# Patient Record
Sex: Female | Born: 1950 | Race: White | Hispanic: No | Smoking: Current every day smoker
Health system: Southern US, Community
[De-identification: ages and names within clinical notes are randomized; demographics above are authoritative.]

## PROBLEM LIST (undated history)

## (undated) DIAGNOSIS — M8580 Other specified disorders of bone density and structure, unspecified site: Secondary | ICD-10-CM

## (undated) DIAGNOSIS — J4540 Moderate persistent asthma, uncomplicated: Secondary | ICD-10-CM

## (undated) DIAGNOSIS — J302 Other seasonal allergic rhinitis: Secondary | ICD-10-CM

## (undated) DIAGNOSIS — H269 Unspecified cataract: Secondary | ICD-10-CM

## (undated) DIAGNOSIS — T7840XA Allergy, unspecified, initial encounter: Secondary | ICD-10-CM

## (undated) HISTORY — PX: LAPAROSCOPIC BILATERAL SALPINGO OOPHERECTOMY: SHX5890

## (undated) HISTORY — PX: ABDOMINAL HYSTERECTOMY: SHX81

## (undated) HISTORY — DX: Other specified disorders of bone density and structure, unspecified site: M85.80

## (undated) HISTORY — DX: Unspecified cataract: H26.9

## (undated) HISTORY — PX: ADENOIDECTOMY: SUR15

## (undated) HISTORY — DX: Allergy, unspecified, initial encounter: T78.40XA

## (undated) HISTORY — DX: Moderate persistent asthma, uncomplicated: J45.40

## (undated) HISTORY — PX: TONSILLECTOMY AND ADENOIDECTOMY: SHX28

## (undated) HISTORY — PX: TONSILLECTOMY: SUR1361

## (undated) HISTORY — DX: Other seasonal allergic rhinitis: J30.2

---

## 1999-11-21 ENCOUNTER — Encounter: Admission: RE | Admit: 1999-11-21 | Discharge: 1999-11-21 | Payer: Self-pay | Admitting: Family Medicine

## 1999-11-21 ENCOUNTER — Encounter: Payer: Self-pay | Admitting: Family Medicine

## 2002-02-01 ENCOUNTER — Ambulatory Visit (HOSPITAL_COMMUNITY): Admission: RE | Admit: 2002-02-01 | Discharge: 2002-02-01 | Payer: Self-pay | Admitting: Internal Medicine

## 2005-01-21 ENCOUNTER — Ambulatory Visit (HOSPITAL_COMMUNITY): Admission: RE | Admit: 2005-01-21 | Discharge: 2005-01-21 | Payer: Self-pay | Admitting: Family Medicine

## 2005-10-03 ENCOUNTER — Emergency Department (HOSPITAL_COMMUNITY): Admission: EM | Admit: 2005-10-03 | Discharge: 2005-10-03 | Payer: Self-pay | Admitting: Emergency Medicine

## 2006-03-02 ENCOUNTER — Ambulatory Visit (HOSPITAL_COMMUNITY): Admission: RE | Admit: 2006-03-02 | Discharge: 2006-03-02 | Payer: Self-pay | Admitting: Family Medicine

## 2008-03-31 ENCOUNTER — Emergency Department (HOSPITAL_COMMUNITY): Admission: EM | Admit: 2008-03-31 | Discharge: 2008-03-31 | Payer: Self-pay | Admitting: Emergency Medicine

## 2008-10-05 DIAGNOSIS — M8580 Other specified disorders of bone density and structure, unspecified site: Secondary | ICD-10-CM

## 2008-10-05 HISTORY — DX: Other specified disorders of bone density and structure, unspecified site: M85.80

## 2009-02-04 ENCOUNTER — Ambulatory Visit (HOSPITAL_COMMUNITY): Admission: RE | Admit: 2009-02-04 | Discharge: 2009-02-04 | Payer: Self-pay | Admitting: Family Medicine

## 2009-02-19 ENCOUNTER — Ambulatory Visit (HOSPITAL_COMMUNITY): Admission: RE | Admit: 2009-02-19 | Discharge: 2009-02-19 | Payer: Self-pay | Admitting: Family Medicine

## 2009-03-02 ENCOUNTER — Emergency Department (HOSPITAL_COMMUNITY): Admission: EM | Admit: 2009-03-02 | Discharge: 2009-03-02 | Payer: Self-pay | Admitting: Emergency Medicine

## 2010-02-18 ENCOUNTER — Ambulatory Visit (HOSPITAL_COMMUNITY): Admission: RE | Admit: 2010-02-18 | Discharge: 2010-02-18 | Payer: Self-pay | Admitting: Family Medicine

## 2010-05-23 ENCOUNTER — Emergency Department (HOSPITAL_COMMUNITY): Admission: EM | Admit: 2010-05-23 | Discharge: 2010-05-23 | Payer: Self-pay | Admitting: Emergency Medicine

## 2010-08-13 ENCOUNTER — Emergency Department (HOSPITAL_COMMUNITY): Admission: EM | Admit: 2010-08-13 | Discharge: 2010-08-13 | Payer: Self-pay | Admitting: Emergency Medicine

## 2010-10-09 ENCOUNTER — Ambulatory Visit (HOSPITAL_COMMUNITY): Admission: RE | Admit: 2010-10-09 | Payer: Self-pay | Source: Home / Self Care | Admitting: Family Medicine

## 2010-11-05 ENCOUNTER — Encounter: Payer: Self-pay | Admitting: Family Medicine

## 2010-12-16 LAB — URINALYSIS, ROUTINE W REFLEX MICROSCOPIC: Ketones, ur: 40 mg/dL — AB

## 2010-12-16 LAB — URINE MICROSCOPIC-ADD ON

## 2010-12-16 LAB — URINE CULTURE: Colony Count: NO GROWTH

## 2011-12-21 ENCOUNTER — Other Ambulatory Visit: Payer: Self-pay | Admitting: Family Medicine

## 2011-12-21 DIAGNOSIS — Z139 Encounter for screening, unspecified: Secondary | ICD-10-CM

## 2011-12-24 ENCOUNTER — Ambulatory Visit (HOSPITAL_COMMUNITY)
Admission: RE | Admit: 2011-12-24 | Discharge: 2011-12-24 | Disposition: A | Discharge status: Home / Self Care | Payer: BC Managed Care – PPO | Source: Ambulatory Visit | Attending: Family Medicine | Admitting: Family Medicine

## 2011-12-24 DIAGNOSIS — Z1231 Encounter for screening mammogram for malignant neoplasm of breast: Secondary | ICD-10-CM | POA: Insufficient documentation

## 2011-12-24 DIAGNOSIS — Z139 Encounter for screening, unspecified: Secondary | ICD-10-CM

## 2012-03-01 ENCOUNTER — Other Ambulatory Visit: Payer: Self-pay

## 2012-03-01 ENCOUNTER — Telehealth: Payer: Self-pay

## 2012-03-01 DIAGNOSIS — Z139 Encounter for screening, unspecified: Secondary | ICD-10-CM

## 2012-03-01 NOTE — Telephone Encounter (Signed)
Gastroenterology Pre-Procedure Form    Request Date: 03/01/2012          Requesting Physician: Dr. Lilyan Punt      PATIENT INFORMATION:  Katrina Dean is a 61 y.o., female (DOB=[birth year]).  PROCEDURE: Procedure(s) requested: colonoscopy Procedure Reason: screening for colon cancer  PATIENT REVIEW QUESTIONS: The patient reports the following:   1. Diabetes Melitis: no 2. Joint replacements in the past 12 months: no 3. Major health problems in the past 3 months: no 4. Has an artificial valve or MVP:no 5. Has been advised in past to take antibiotics in advance of a procedure like teeth cleaning: no}    MEDICATIONS & ALLERGIES:    Patient reports the following regarding taking any blood thinners:   Plavix? no Aspirin?no Coumadin?  no  Patient confirms/reports the following medications:  Current Outpatient Prescriptions  Medication Sig Dispense Refill  . Multiple Vitamin (MULTIVITAMIN) tablet Take 1 tablet by mouth daily.      . NON FORMULARY Calcium 600 mg plus Vit D     One tablet daily      . NON FORMULARY Goody powders                      She takes one about twice weekly      . pantoprazole (PROTONIX) 40 MG tablet Take 40 mg by mouth daily.        Patient confirms/reports the following allergies:  Allergies  Allergen Reactions  . Erythromycin Itching and Rash  . Penicillins Itching and Rash    Patient is appropriate to schedule for requested procedure(s): yes  AUTHORIZATION INFORMATION Primary Insurance:   ID #:  Group #:  Pre-Cert / Auth required: Pre-Cert / Auth #:   Secondary Insurance:   ID #:   Group #:  Pre-Cert / Auth required: Pre-Cert / Auth #:   No orders of the defined types were placed in this encounter.    SCHEDULE INFORMATION: Procedure has been scheduled as follows:  Date: 03/17/2012    Time: 12:45 PM  Location: [facility] Short Stay  This Gastroenterology Pre-Precedure Form is being routed to the following provider(s) for  review: R. Roetta Sessions, MD

## 2012-03-03 NOTE — Telephone Encounter (Signed)
OK to proceed with colonoscopy.

## 2012-03-03 NOTE — Telephone Encounter (Signed)
Rx and instructions mailed.  

## 2012-03-11 ENCOUNTER — Encounter (HOSPITAL_COMMUNITY): Payer: Self-pay | Admitting: Pharmacy Technician

## 2012-03-15 ENCOUNTER — Telehealth: Payer: Self-pay

## 2012-03-15 NOTE — Telephone Encounter (Signed)
Pt called to cancel her appt on 03/17/2012 with [facility]MR for colonoscopy. Her manager is going to be out of town for medical reasons and she cannot leave work. 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 Cancelled appt and informed Kim.

## 2012-03-17 ENCOUNTER — Encounter (HOSPITAL_COMMUNITY): Admission: RE | Payer: Self-pay | Source: Ambulatory Visit

## 2012-03-17 ENCOUNTER — Ambulatory Visit (HOSPITAL_COMMUNITY)
Admission: RE | Admit: 2012-03-17 | Payer: BC Managed Care – PPO | Source: Ambulatory Visit | Admitting: Internal Medicine

## 2012-03-17 SURGERY — COLONOSCOPY
Anesthesia: Moderate Sedation

## 2012-06-01 ENCOUNTER — Telehealth: Payer: Self-pay

## 2012-06-01 NOTE — Telephone Encounter (Signed)
Called pt to see if she is ready to reschedule the colonoscopy that she had to cancel in June with RMR. She said she is still doing some state testing and she will call when she is ready. She said it will probably be at least another month.

## 2013-02-06 ENCOUNTER — Telehealth: Payer: Self-pay | Admitting: Nurse Practitioner

## 2013-02-06 DIAGNOSIS — E785 Hyperlipidemia, unspecified: Secondary | ICD-10-CM

## 2013-02-06 DIAGNOSIS — Z0000 Encounter for general adult medical examination without abnormal findings: Secondary | ICD-10-CM

## 2013-02-06 DIAGNOSIS — D649 Anemia, unspecified: Secondary | ICD-10-CM

## 2013-02-06 DIAGNOSIS — E559 Vitamin D deficiency, unspecified: Secondary | ICD-10-CM

## 2013-02-06 NOTE — Telephone Encounter (Signed)
bw papers ready for pick up. Pt notified.

## 2013-02-06 NOTE — Telephone Encounter (Signed)
BW papers for PE on 5/15 please add any test for anemia & Lukemia (due to family History)

## 2013-02-06 NOTE — Telephone Encounter (Signed)
Typically a CBC will indicate if there is any problems with the bone marrow. So therefore do a CBC. Also patient needs vitamin D level, glucose, lipid profile as part of her screenings. She does have a history of hyperlipidemia and vitamin D deficiency.

## 2013-02-09 LAB — CBC WITH DIFFERENTIAL/PLATELET
Basophils Absolute: 0 10*3/uL (ref 0.0–0.1)
Basophils Relative: 1 % (ref 0–1)
Eosinophils Absolute: 0.1 10*3/uL (ref 0.0–0.7)
HCT: 40.7 % (ref 36.0–46.0)
Hemoglobin: 13.8 g/dL (ref 12.0–15.0)
Lymphocytes Relative: 39 % (ref 12–46)
Monocytes Absolute: 0.4 10*3/uL (ref 0.1–1.0)
RDW: 13.2 % (ref 11.5–15.5)
WBC: 6.1 10*3/uL (ref 4.0–10.5)

## 2013-02-09 LAB — LIPID PANEL
Cholesterol: 235 mg/dL — ABNORMAL HIGH (ref 0–200)
HDL: 49 mg/dL (ref >39)
Triglycerides: 100 mg/dL (ref <150)

## 2013-02-10 LAB — VITAMIN D 25 HYDROXY (VIT D DEFICIENCY, FRACTURES): Vit D, 25-Hydroxy: 23 ng/mL — ABNORMAL LOW (ref 30–89)

## 2013-02-13 ENCOUNTER — Encounter: Payer: Self-pay | Admitting: Family Medicine

## 2013-02-14 ENCOUNTER — Encounter: Payer: Self-pay | Admitting: *Deleted

## 2013-02-16 ENCOUNTER — Encounter: Payer: Self-pay | Admitting: Nurse Practitioner

## 2013-02-16 ENCOUNTER — Encounter: Payer: Self-pay | Admitting: Family Medicine

## 2013-02-16 ENCOUNTER — Ambulatory Visit (INDEPENDENT_AMBULATORY_CARE_PROVIDER_SITE_OTHER): Payer: 59 | Admitting: Nurse Practitioner

## 2013-02-16 VITALS — BP 134/90 | HR 70 | Ht 63.5 in | Wt 157.4 lb

## 2013-02-16 DIAGNOSIS — Z0000 Encounter for general adult medical examination without abnormal findings: Secondary | ICD-10-CM

## 2013-02-16 DIAGNOSIS — M949 Disorder of cartilage, unspecified: Secondary | ICD-10-CM

## 2013-02-16 DIAGNOSIS — Z79899 Other long term (current) drug therapy: Secondary | ICD-10-CM

## 2013-02-16 DIAGNOSIS — Z01419 Encounter for gynecological examination (general) (routine) without abnormal findings: Secondary | ICD-10-CM

## 2013-02-16 DIAGNOSIS — M899 Disorder of bone, unspecified: Secondary | ICD-10-CM

## 2013-02-16 DIAGNOSIS — M8580 Other specified disorders of bone density and structure, unspecified site: Secondary | ICD-10-CM

## 2013-02-16 DIAGNOSIS — E785 Hyperlipidemia, unspecified: Secondary | ICD-10-CM

## 2013-02-16 MED ORDER — PRAVASTATIN SODIUM 20 MG PO TABS: 20.0000 mg | ORAL_TABLET | Freq: Every day | ORAL | Status: DC

## 2013-02-16 NOTE — Progress Notes (Signed)
Screening mammogram scheduled [facility] 02-21-13 at 12:30. Bone Density Test at [facility] 02-21-2013 2:15. Pt notified. Hemoccult cards given and explained to patient. Colonoscopy info sheet given to patient. Patient can make her own appointment for colonoscopy.

## 2013-02-16 NOTE — Patient Instructions (Signed)
[facility] Dept; [email or web address]

## 2013-02-20 ENCOUNTER — Encounter: Payer: Self-pay | Admitting: Nurse Practitioner

## 2013-02-20 DIAGNOSIS — E785 Hyperlipidemia, unspecified: Secondary | ICD-10-CM | POA: Insufficient documentation

## 2013-02-20 DIAGNOSIS — M8580 Other specified disorders of bone density and structure, unspecified site: Secondary | ICD-10-CM | POA: Insufficient documentation

## 2013-02-20 NOTE — Assessment & Plan Note (Signed)
Start pravastatin 20 mg daily. Repeat lipid and liver profiles in 8-10 weeks.

## 2013-02-20 NOTE — Assessment & Plan Note (Signed)
Repeat bone density ordered. Continue calcium and vitamin D supplement.

## 2013-02-20 NOTE — Progress Notes (Signed)
  Subjective:    Patient ID: Katrina Dean, female    DOB: 12/26/1950, 62 y.o.   MRN: [id number]  HPI presents for her wellness checkup. Gets regular eye exams. Has not had dental exam in a while, recommend that she get this before the end of the year. Takes regular calcium and vitamin D. Is due for her mammogram. Married, same sexual partner. No vaginal discharge. No pelvic pain. Has not had her colonoscopy.    Review of Systems  Constitutional: Positive for fatigue. Negative for activity change and appetite change.  HENT: Negative for dental problem.   Eyes: Negative for visual disturbance.  Respiratory: Negative for chest tightness, shortness of breath and wheezing.   Cardiovascular: Negative for chest pain.  Gastrointestinal: Negative for vomiting, diarrhea, constipation, blood in stool and abdominal distention.  Genitourinary: Negative for dysuria, vaginal discharge, enuresis, difficulty urinating and pelvic pain.   no unusual cough. No hemoptysis. No edema.     Objective:   Physical Exam  Constitutional: She is oriented to person, place, and time. She appears well-developed. No distress.  HENT:  Right Ear: External ear normal.  Left Ear: External ear normal.  Mouth/Throat: Oropharynx is clear and moist.  Neck: Normal range of motion. Neck supple. No tracheal deviation present. No thyromegaly present.  Cardiovascular: Normal rate, regular rhythm and normal heart sounds.  Exam reveals no gallop.   No murmur heard. Pulmonary/Chest: Effort normal and breath sounds normal.  Abdominal: Soft. She exhibits no distension and no mass. There is no tenderness.  Genitourinary: Vagina normal. No vaginal discharge found.  Musculoskeletal: She exhibits no edema.  Lymphadenopathy:    She has no cervical adenopathy.  Neurological: She is alert and oriented to person, place, and time.  Skin: Skin is warm and dry. No rash noted.  Psychiatric: She has a normal mood and affect. Her behavior is  normal.   breast exam: No masses noted. Axilla no adenopathy. Rectal exam normal with, no stool for Hemoccult. EGBUS normal limit. Vagina pale and moist.       Assessment & Plan:  Well woman exam - Plan: MM Digital Screening, POC Hemoccult Bld/Stl (3-Cd Home Screen), MM Digital Screening, POC Hemoccult Bld/Stl (3-Cd Home Screen)  Hyperlipemia - Plan: Lipid panel, Lipid panel  High risk medication use - Plan: Hepatic function panel, Hepatic function panel  Osteopenia - Plan: DG Bone Density, DG Bone Density  Routine general medical examination at a health care facility - Plan: MM Digital Screening, POC Hemoccult Bld/Stl (3-Cd Home Screen), MM Digital Screening, POC Hemoccult Bld/Stl (3-Cd Home Screen)  Plan: Reviewed her most recent lipid profile. Discussed risk associated with elevated LDL particularly with smoking. Meds ordered this encounter  Medications  . pravastatin (PRAVACHOL) 20 MG tablet    Sig: Take 1 tablet (20 mg total) by mouth daily. For cholesterol    Dispense:  30 tablet    Refill:  2    Order Specific Question:  Supervising Provider    Answer:  Merlyn Albert [[id number]]   repeat lipid and liver profile in 8-10 weeks, call back sooner if any problems with medication. Recommend regular exercise. Healthy diet. Dental exam performed into the year. Given information on colonoscopy. Next physical in one year.

## 2013-02-21 ENCOUNTER — Ambulatory Visit (HOSPITAL_COMMUNITY)
Admission: RE | Admit: 2013-02-21 | Discharge: 2013-02-21 | Disposition: A | Discharge status: Home / Self Care | Payer: 59 | Source: Ambulatory Visit | Attending: Nurse Practitioner | Admitting: Nurse Practitioner

## 2013-02-21 DIAGNOSIS — Z1231 Encounter for screening mammogram for malignant neoplasm of breast: Secondary | ICD-10-CM | POA: Insufficient documentation

## 2013-02-21 DIAGNOSIS — M949 Disorder of cartilage, unspecified: Secondary | ICD-10-CM | POA: Insufficient documentation

## 2013-02-21 DIAGNOSIS — M899 Disorder of bone, unspecified: Secondary | ICD-10-CM | POA: Insufficient documentation

## 2013-02-23 ENCOUNTER — Other Ambulatory Visit: Payer: Self-pay | Admitting: Nurse Practitioner

## 2013-02-24 ENCOUNTER — Other Ambulatory Visit: Payer: Self-pay | Admitting: Nurse Practitioner

## 2013-02-24 ENCOUNTER — Telehealth: Payer: Self-pay | Admitting: Family Medicine

## 2013-02-24 DIAGNOSIS — R928 Other abnormal and inconclusive findings on diagnostic imaging of breast: Secondary | ICD-10-CM

## 2013-02-24 NOTE — Telephone Encounter (Signed)
Patient wanted to get mammogram for May 28. I called [facility] and they were competely booked for that day. Pt notified she is going to keep her appointment for june

## 2013-02-24 NOTE — Telephone Encounter (Signed)
Patient is calling because she has a question about her mammogram

## 2013-03-01 LAB — POC HEMOCCULT BLD/STL (HOME/3-CARD/SCREEN): Card #3 Fecal Occult Blood, POC: NEGATIVE

## 2013-03-08 ENCOUNTER — Ambulatory Visit (HOSPITAL_COMMUNITY)
Admission: RE | Admit: 2013-03-08 | Discharge: 2013-03-08 | Disposition: A | Discharge status: Home / Self Care | Payer: 59 | Source: Ambulatory Visit | Attending: Nurse Practitioner | Admitting: Nurse Practitioner

## 2013-03-08 DIAGNOSIS — R928 Other abnormal and inconclusive findings on diagnostic imaging of breast: Secondary | ICD-10-CM

## 2013-03-08 DIAGNOSIS — N6002 Solitary cyst of left breast: Secondary | ICD-10-CM | POA: Insufficient documentation

## 2013-03-15 ENCOUNTER — Telehealth: Payer: Self-pay | Admitting: Nurse Practitioner

## 2013-03-15 NOTE — Telephone Encounter (Signed)
Pt advised card negative x 3.

## 2013-03-15 NOTE — Telephone Encounter (Signed)
Would like to know if results from Hemocult test are back?  If so, Patient would like to know the results.  Thanks

## 2013-03-27 ENCOUNTER — Ambulatory Visit (INDEPENDENT_AMBULATORY_CARE_PROVIDER_SITE_OTHER): Payer: 59 | Admitting: Family Medicine

## 2013-03-27 ENCOUNTER — Encounter: Payer: Self-pay | Admitting: Family Medicine

## 2013-03-27 VITALS — BP 120/86 | Temp 98.8°F | Ht 63.75 in | Wt 156.2 lb

## 2013-03-27 DIAGNOSIS — J019 Acute sinusitis, unspecified: Secondary | ICD-10-CM

## 2013-03-27 MED ORDER — DOXYCYCLINE HYCLATE 100 MG PO CAPS: 100.0000 mg | ORAL_CAPSULE | Freq: Two times a day (BID) | ORAL | Status: DC

## 2013-03-27 NOTE — Progress Notes (Signed)
  Subjective:    Patient ID: Katrina Dean, female    DOB: Jun 16, 1951, 62 y.o.   MRN: [id number]  Sinusitis This is a new problem. The current episode started in the past 7 days. The problem is unchanged. There has been no fever. Her pain is at a severity of 3/10. The pain is moderate. Associated symptoms include congestion and coughing. Pertinent negatives include no ear pain or shortness of breath. (Drainage) Past treatments include nothing. The treatment provided no relief.  Patient also has a stye on left eye since accidentally getting some shampoo in it over the weekend.    Review of Systems  Constitutional: Negative for fever and activity change.  HENT: Positive for congestion and rhinorrhea. Negative for ear pain.   Eyes: Negative for discharge.  Respiratory: Positive for cough. Negative for shortness of breath and wheezing.   Cardiovascular: Negative for chest pain.       Objective:   Physical Exam  Nursing note and vitals reviewed. Constitutional: She appears well-developed.  HENT:  Head: Normocephalic.  Right Ear: External ear normal.  Left Ear: External ear normal.  Nose: Nose normal.  Mouth/Throat: Oropharynx is clear and moist. No oropharyngeal exudate.  Left eye stye  Neck: Neck supple.  Cardiovascular: Normal rate and normal heart sounds.   No murmur heard. Pulmonary/Chest: Effort normal and breath sounds normal. She has no wheezes.  Lymphadenopathy:    She has no cervical adenopathy.  Skin: Skin is warm and dry.   Left eye has a ear edematous area along the base of the eye lid consistent with a stye       Assessment & Plan:  Stye-doxycycline twice a day for the next 10 days followup if problems Sinusitis-doxycycline twice a day 10 days Patient now smoking a couple cigarettes a day I told her it be best to totally quit

## 2013-09-22 ENCOUNTER — Other Ambulatory Visit: Payer: Self-pay | Admitting: Family Medicine

## 2013-09-22 DIAGNOSIS — N649 Disorder of breast, unspecified: Secondary | ICD-10-CM

## 2013-09-29 ENCOUNTER — Ambulatory Visit
Admission: RE | Admit: 2013-09-29 | Discharge: 2013-09-29 | Disposition: A | Discharge status: Home / Self Care | Payer: 59 | Source: Ambulatory Visit | Attending: Family Medicine | Admitting: Family Medicine

## 2013-09-29 DIAGNOSIS — N649 Disorder of breast, unspecified: Secondary | ICD-10-CM

## 2014-03-12 ENCOUNTER — Encounter: Payer: Self-pay | Admitting: Family Medicine

## 2014-03-12 ENCOUNTER — Ambulatory Visit (INDEPENDENT_AMBULATORY_CARE_PROVIDER_SITE_OTHER): Payer: 59 | Admitting: Family Medicine

## 2014-03-12 VITALS — BP 128/80 | Temp 98.3°F | Ht 64.0 in | Wt 159.1 lb

## 2014-03-12 DIAGNOSIS — R197 Diarrhea, unspecified: Secondary | ICD-10-CM

## 2014-03-12 DIAGNOSIS — Z79899 Other long term (current) drug therapy: Secondary | ICD-10-CM

## 2014-03-12 DIAGNOSIS — M949 Disorder of cartilage, unspecified: Secondary | ICD-10-CM

## 2014-03-12 DIAGNOSIS — M899 Disorder of bone, unspecified: Secondary | ICD-10-CM

## 2014-03-12 DIAGNOSIS — M8580 Other specified disorders of bone density and structure, unspecified site: Secondary | ICD-10-CM

## 2014-03-12 DIAGNOSIS — R109 Unspecified abdominal pain: Secondary | ICD-10-CM

## 2014-03-12 DIAGNOSIS — E785 Hyperlipidemia, unspecified: Secondary | ICD-10-CM

## 2014-03-12 MED ORDER — BUPROPION HCL ER (SR) 150 MG PO TB12: 150.0000 mg | ORAL_TABLET | Freq: Two times a day (BID) | ORAL | Status: DC

## 2014-03-12 MED ORDER — DIPHENOXYLATE-ATROPINE 2.5-0.025 MG PO TABS: 1.0000 | ORAL_TABLET | Freq: Two times a day (BID) | ORAL | Status: DC | PRN

## 2014-03-12 NOTE — Progress Notes (Signed)
   Subjective:    Patient ID: Katrina Dean, female    DOB: 03-20-1951, 63 y.o.   MRN: [id number]  Emesis  This is a new problem. The current episode started in the past 7 days. The problem occurs intermittently. The problem has been gradually worsening. The emesis has an appearance of stomach contents. Maximum temperature: 99.2. Associated symptoms include abdominal pain, diarrhea and dizziness. Pertinent negatives include no coughing. Associated symptoms comments: nausea. Treatments tried: pepto bismol, pepcid, tums. The treatment provided no relief.   she does have an issue of lactose intolerance. Patient states she has a tick bite on her chest she would like the doctor to look at.   Patient also has a history of hyperlipidemia he she is not had any testing recently to look at this.  Review of Systems  Respiratory: Negative for cough.   Gastrointestinal: Positive for vomiting, abdominal pain and diarrhea.  Neurological: Positive for dizziness.       Objective:   Physical Exam  Vitals reviewed. Constitutional: She appears well-nourished. No distress.  Cardiovascular: Normal rate, regular rhythm and normal heart sounds.   No murmur heard. Pulmonary/Chest: Effort normal and breath sounds normal. No respiratory distress.  Abdominal: Soft. She exhibits no distension. There is tenderness. There is no rebound and no guarding.  Musculoskeletal: She exhibits no edema.  Lymphadenopathy:    She has no cervical adenopathy.  Neurological: She is alert. She exhibits normal muscle tone.  Psychiatric: Her behavior is normal.          Assessment & Plan:  #1 diarrhea-this been going on for several weeks. I really doubt that it's do to routine infection. We will be doing some tests to rule out other possibilities. Warning signs were discussed. If bloody stools high fever or worse call back Lomotil up to twice daily as needed await the results of the other tests.  #2 patient had a tick bite  I find no evidence of infection going on there if high fevers muscle aches worse call back  #3 smoking abuse patient going back to smoking she's been counseled that she really needs to quit

## 2014-03-13 ENCOUNTER — Other Ambulatory Visit: Payer: Self-pay | Admitting: Family Medicine

## 2014-03-13 DIAGNOSIS — N6489 Other specified disorders of breast: Secondary | ICD-10-CM

## 2014-03-13 LAB — CBC WITH DIFFERENTIAL/PLATELET
Basophils Absolute: 0.1 10*3/uL (ref 0.0–0.1)
Basophils Relative: 1 % (ref 0–1)
Eosinophils Absolute: 0.1 10*3/uL (ref 0.0–0.7)
Eosinophils Relative: 1 % (ref 0–5)
HCT: 44.1 % (ref 36.0–46.0)
Hemoglobin: 15.2 g/dL — ABNORMAL HIGH (ref 12.0–15.0)
Lymphocytes Relative: 41 % (ref 12–46)
Lymphs Abs: 2.3 10*3/uL (ref 0.7–4.0)
MCH: 30.8 pg (ref 26.0–34.0)
MCHC: 34.5 g/dL (ref 30.0–36.0)
MCV: 89.3 fL (ref 78.0–100.0)
Monocytes Absolute: 0.3 10*3/uL (ref 0.1–1.0)
Monocytes Relative: 6 % (ref 3–12)
Neutro Abs: 2.9 10*3/uL (ref 1.7–7.7)
Neutrophils Relative %: 51 % (ref 43–77)
Platelets: 257 10*3/uL (ref 150–400)
RBC: 4.94 MIL/uL (ref 3.87–5.11)
RDW: 13 % (ref 11.5–15.5)
WBC: 5.6 10*3/uL (ref 4.0–10.5)

## 2014-03-13 LAB — LIPID PANEL
Cholesterol: 244 mg/dL — ABNORMAL HIGH (ref 0–200)
HDL: 45 mg/dL
LDL Cholesterol: 176 mg/dL — ABNORMAL HIGH (ref 0–99)
Total CHOL/HDL Ratio: 5.4 ratio
Triglycerides: 113 mg/dL
VLDL: 23 mg/dL (ref 0–40)

## 2014-03-13 LAB — HEPATIC FUNCTION PANEL
ALT: 8 U/L (ref 0–35)
AST: 13 U/L (ref 0–37)
Albumin: 4.5 g/dL (ref 3.5–5.2)
Alkaline Phosphatase: 52 U/L (ref 39–117)
Bilirubin, Direct: 0.1 mg/dL (ref 0.0–0.3)
Indirect Bilirubin: 0.6 mg/dL (ref 0.2–1.2)
Total Bilirubin: 0.7 mg/dL (ref 0.2–1.2)
Total Protein: 7.6 g/dL (ref 6.0–8.3)

## 2014-03-13 LAB — BASIC METABOLIC PANEL
BUN: 9 mg/dL (ref 6–23)
CHLORIDE: 104 meq/L (ref 96–112)
CO2: 23 meq/L (ref 19–32)
CREATININE: 0.85 mg/dL (ref 0.50–1.10)
Calcium: 9.8 mg/dL (ref 8.4–10.5)
Glucose, Bld: 93 mg/dL (ref 70–99)
POTASSIUM: 4.7 meq/L (ref 3.5–5.3)
SODIUM: 139 meq/L (ref 135–145)

## 2014-03-14 LAB — TISSUE TRANSGLUTAMINASE, IGA: Tissue Transglutaminase Ab, IgA: 3.7 U/mL (ref <20)

## 2014-03-14 LAB — FECAL LACTOFERRIN, QUANT: LACTOFERRIN: NEGATIVE

## 2014-03-14 LAB — OVA AND PARASITE EXAMINATION: OP: NONE SEEN

## 2014-03-14 LAB — C. DIFFICILE GDH AND TOXIN A/B
C. DIFF TOXIN A/B: NOT DETECTED
C. difficile GDH: NOT DETECTED

## 2014-03-14 LAB — VITAMIN D 25 HYDROXY (VIT D DEFICIENCY, FRACTURES): Vit D, 25-Hydroxy: 24 ng/mL — ABNORMAL LOW (ref 30–89)

## 2014-03-17 LAB — STOOL CULTURE

## 2014-03-23 ENCOUNTER — Encounter: Payer: Self-pay | Admitting: Family Medicine

## 2014-03-23 ENCOUNTER — Ambulatory Visit (INDEPENDENT_AMBULATORY_CARE_PROVIDER_SITE_OTHER): Payer: 59 | Admitting: Family Medicine

## 2014-03-23 VITALS — BP 122/80 | Ht 64.0 in | Wt 161.1 lb

## 2014-03-23 DIAGNOSIS — D582 Other hemoglobinopathies: Secondary | ICD-10-CM

## 2014-03-23 DIAGNOSIS — E785 Hyperlipidemia, unspecified: Secondary | ICD-10-CM

## 2014-03-23 DIAGNOSIS — E559 Vitamin D deficiency, unspecified: Secondary | ICD-10-CM

## 2014-03-23 DIAGNOSIS — Z639 Problem related to primary support group, unspecified: Secondary | ICD-10-CM

## 2014-03-23 DIAGNOSIS — F439 Reaction to severe stress, unspecified: Secondary | ICD-10-CM

## 2014-03-23 MED ORDER — PRAVASTATIN SODIUM 20 MG PO TABS: 20.0000 mg | ORAL_TABLET | Freq: Every day | ORAL | Status: DC

## 2014-03-23 NOTE — Patient Instructions (Signed)
DASH Eating Plan  DASH stands for "Dietary Approaches to Stop Hypertension." The DASH eating plan is a healthy eating plan that has been shown to reduce high blood pressure (hypertension). Additional health benefits may include reducing the risk of type 2 diabetes mellitus, heart disease, and stroke. The DASH eating plan may also help with weight loss.  WHAT DO I NEED TO KNOW ABOUT THE DASH EATING PLAN?  For the DASH eating plan, you will follow these general guidelines:  · Choose foods with a percent daily value for sodium of less than 5% (as listed on the food label).  · Use salt-free seasonings or herbs instead of table salt or sea salt.  · Check with your health care Tova Vater or pharmacist before using salt substitutes.  · Eat lower-sodium products, often labeled as "lower sodium" or "no salt added."  · Eat fresh foods.  · Eat more vegetables, fruits, and low-fat dairy products.  · Choose whole grains. Look for the word "whole" as the first word in the ingredient list.  · Choose fish and skinless chicken or turkey more often than red meat. Limit fish, poultry, and meat to 6 oz (170 g) each day.  · Limit sweets, desserts, sugars, and sugary drinks.  · Choose heart-healthy fats.  · Limit cheese to 1 oz (28 g) per day.  · Eat more home-cooked food and less restaurant, buffet, and fast food.  · Limit fried foods.  · Cook foods using methods other than frying.  · Limit canned vegetables. If you do use them, rinse them well to decrease the sodium.  · When eating at a restaurant, ask that your food be prepared with less salt, or no salt if possible.  WHAT FOODS CAN I EAT?  Seek help from a dietitian for individual calorie needs.  Grains  Whole grain or whole wheat bread. Brown rice. Whole grain or whole wheat pasta. Quinoa, bulgur, and whole grain cereals. Low-sodium cereals. Corn or whole wheat flour tortillas. Whole grain cornbread. Whole grain crackers. Low-sodium crackers.  Vegetables  Fresh or frozen vegetables  (raw, steamed, roasted, or grilled). Low-sodium or reduced-sodium tomato and vegetable juices. Low-sodium or reduced-sodium tomato sauce and paste. Low-sodium or reduced-sodium canned vegetables.   Fruits  All fresh, canned (in natural juice), or frozen fruits.  Meat and Other Protein Products  Ground beef (85% or leaner), grass-fed beef, or beef trimmed of fat. Skinless chicken or turkey. Ground chicken or turkey. Pork trimmed of fat. All fish and seafood. Eggs. Dried beans, peas, or lentils. Unsalted nuts and seeds. Unsalted canned beans.  Dairy  Low-fat dairy products, such as skim or 1% milk, 2% or reduced-fat cheeses, low-fat ricotta or cottage cheese, or plain low-fat yogurt. Low-sodium or reduced-sodium cheeses.  Fats and Oils  Tub margarines without trans fats. Light or reduced-fat mayonnaise and salad dressings (reduced sodium). Avocado. Safflower, olive, or canola oils. Natural peanut or almond butter.  Other  Unsalted popcorn and pretzels.  The items listed above may not be a complete list of recommended foods or beverages. Contact your dietitian for more options.  WHAT FOODS ARE NOT RECOMMENDED?  Grains  White bread. White pasta. White rice. Refined cornbread. Bagels and croissants. Crackers that contain trans fat.  Vegetables  Creamed or fried vegetables. Vegetables in a cheese sauce. Regular canned vegetables. Regular canned tomato sauce and paste. Regular tomato and vegetable juices.  Fruits  Dried fruits. Canned fruit in light or heavy syrup. Fruit juice.  Meat and Other Protein   Products  Fatty cuts of meat. Ribs, chicken wings, bacon, sausage, bologna, salami, chitterlings, fatback, hot dogs, bratwurst, and packaged luncheon meats. Salted nuts and seeds. Canned beans with salt.  Dairy  Whole or 2% milk, cream, half-and-half, and cream cheese. Whole-fat or sweetened yogurt. Full-fat cheeses or blue cheese. Nondairy creamers and whipped toppings. Processed cheese, cheese spreads, or cheese  curds.  Condiments  Onion and garlic salt, seasoned salt, table salt, and sea salt. Canned and packaged gravies. Worcestershire sauce. Tartar sauce. Barbecue sauce. Teriyaki sauce. Soy sauce, including reduced sodium. Steak sauce. Fish sauce. Oyster sauce. Cocktail sauce. Horseradish. Ketchup and mustard. Meat flavorings and tenderizers. Bouillon cubes. Hot sauce. Tabasco sauce. Marinades. Taco seasonings. Relishes.  Fats and Oils  Butter, stick margarine, lard, shortening, ghee, and bacon fat. Coconut, palm kernel, or palm oils. Regular salad dressings.  Other  Pickles and olives. Salted popcorn and pretzels.  The items listed above may not be a complete list of foods and beverages to avoid. Contact your dietitian for more information.  WHERE CAN I FIND MORE INFORMATION?  National Heart, Lung, and Blood Institute: www.nhlbi.nih.gov/health/health-topics/topics/dash/  Document Released: 09/10/2011 Document Revised: 09/26/2013 Document Reviewed: 07/26/2013  ExitCare® Patient Information ©2015 [employer]. This information is not intended to replace advice given to you by your health care Katrina Dean. Make sure you discuss any questions you have with your health care Guadalupe Kerekes.

## 2014-03-23 NOTE — Progress Notes (Signed)
   Subjective:    Patient ID: Katrina Dean, female    DOB: 01/08/1951, 63 y.o.   MRN: [id number]  HPI Patient is here today to discuss her recent lab work results and possible treatment for abnormal values. Patient states that her hemoglobin is 15.1 and she is concerned about this. She does admit to smoking occasionally. He states he had many good until just recently. There is a family history of cardiovascular disease and cholesterol. She is under a lot of stress at home as well.     Review of Systems She denies chest pain shortness of breath she denies nausea vomiting abdominal pain she states the diarrhea has improved. She denies any rectal bleeding. She denies sweats chills muscle joint pains.    Objective:   Physical Exam  Lungs clear hearts regular pulse normal blood pressure good extremities no edema neck no masses  I went over all of her lab work including elevated hemoglobin liver enzymes cholesterol profile and kidney functions.      Assessment & Plan:  #1 hyperlipidemia-severe puts her at significant risk of heart disease I encourage her to do 81 mg aspirin also encourage her to start cholesterol medicine she would like to try pravastatin 20 mg initially she will take it 3 days per week we will check a lipid liver profile in approximately 8-12 weeks with followup office visit if it causes muscle pains discomfort or other side effects he is to stop it and let us know  #2 slight elevation in hemoglobin this is related to occasional use of smoking. I told her she needs to stop smoking. This should go back to being normal. Can recheck this in 6-12 months.  #3 patient is trying to eat healthy exercise I encouraged her that she's under a lot of stress with her relationship at home  #4 vitamin D level is low she was encouraged to take at least 2000 IUs vitamin D daily. She will do one calcium a day.

## 2014-03-26 ENCOUNTER — Other Ambulatory Visit: Payer: Self-pay | Admitting: Family Medicine

## 2014-03-26 ENCOUNTER — Other Ambulatory Visit: Payer: Self-pay

## 2014-03-26 DIAGNOSIS — N6489 Other specified disorders of breast: Secondary | ICD-10-CM

## 2014-03-30 ENCOUNTER — Ambulatory Visit
Admission: RE | Admit: 2014-03-30 | Discharge: 2014-03-30 | Disposition: A | Discharge status: Home / Self Care | Payer: 59 | Source: Ambulatory Visit | Attending: Family Medicine | Admitting: Family Medicine

## 2014-03-30 DIAGNOSIS — N6489 Other specified disorders of breast: Secondary | ICD-10-CM

## 2014-10-12 ENCOUNTER — Encounter: Payer: Self-pay | Admitting: Family Medicine

## 2014-10-12 ENCOUNTER — Other Ambulatory Visit: Payer: Self-pay | Admitting: *Deleted

## 2014-10-12 ENCOUNTER — Ambulatory Visit (INDEPENDENT_AMBULATORY_CARE_PROVIDER_SITE_OTHER): Payer: 59 | Admitting: Family Medicine

## 2014-10-12 VITALS — BP 124/80 | Temp 98.6°F | Ht 64.0 in | Wt 158.2 lb

## 2014-10-12 DIAGNOSIS — J0100 Acute maxillary sinusitis, unspecified: Secondary | ICD-10-CM

## 2014-10-12 MED ORDER — ALBUTEROL SULFATE HFA 108 (90 BASE) MCG/ACT IN AERS: INHALATION_SPRAY | RESPIRATORY_TRACT | Status: DC

## 2014-10-12 MED ORDER — LEVOFLOXACIN 500 MG PO TABS: 500.0000 mg | ORAL_TABLET | Freq: Every day | ORAL | Status: DC

## 2014-10-12 MED ORDER — CLONAZEPAM 0.5 MG PO TABS: 0.5000 mg | ORAL_TABLET | Freq: Two times a day (BID) | ORAL | Status: DC | PRN

## 2014-10-12 MED ORDER — AZITHROMYCIN 250 MG PO TABS: ORAL_TABLET | ORAL | Status: DC

## 2014-10-12 MED ORDER — ALBUTEROL SULFATE HFA 108 (90 BASE) MCG/ACT IN AERS: 2.0000 | INHALATION_SPRAY | Freq: Four times a day (QID) | RESPIRATORY_TRACT | Status: DC | PRN

## 2014-10-12 NOTE — Progress Notes (Signed)
   Subjective:    Patient ID: Katrina Dean, female    DOB: Aug 19, 1951, 64 y.o.   MRN: [id number]  Cough This is a new problem. The current episode started in the past 7 days. The problem has been unchanged. The cough is non-productive. Associated symptoms include ear pain, rhinorrhea and a sore throat. Pertinent negatives include no chest pain, fever, shortness of breath or wheezing. Associated symptoms comments: congestion. The symptoms are aggravated by dust. She has tried nothing for the symptoms. The treatment provided no relief.   Patient states that she just lost her mother and she needs a prescription for her Klonopin.    Review of Systems  Constitutional: Negative for fever and activity change.  HENT: Positive for congestion, ear pain, rhinorrhea and sore throat.   Eyes: Negative for discharge.  Respiratory: Positive for cough. Negative for shortness of breath and wheezing.   Cardiovascular: Negative for chest pain.       Objective:   Physical Exam  Constitutional: She appears well-developed.  HENT:  Head: Normocephalic.  Nose: Nose normal.  Mouth/Throat: Oropharynx is clear and moist. No oropharyngeal exudate.  Neck: Neck supple.  Cardiovascular: Normal rate and normal heart sounds.   No murmur heard. Pulmonary/Chest: Effort normal and breath sounds normal. She has no wheezes.  Lymphadenopathy:    She has no cervical adenopathy.  Skin: Skin is warm and dry.  Nursing note and vitals reviewed.         Assessment & Plan:  Anxiety issues related to recent death of her mother in 09-22-23. Klonopin prescribed she stated she will only use this occasionally she denies being depressed. I told the patient if she is not significantly better over the next 3-4 weeks she ought to consider antidepressant  Acute bacterial sinusitis as a result of a recent viral illness. If progressive symptoms or problems follow-up, she gets occasional reactive airway inhaler sent in

## 2014-10-30 ENCOUNTER — Encounter: Payer: Self-pay | Admitting: Family Medicine

## 2014-10-30 ENCOUNTER — Ambulatory Visit (INDEPENDENT_AMBULATORY_CARE_PROVIDER_SITE_OTHER): Payer: 59 | Admitting: Family Medicine

## 2014-10-30 VITALS — BP 124/82 | Temp 98.9°F | Ht 64.0 in | Wt 156.0 lb

## 2014-10-30 DIAGNOSIS — J329 Chronic sinusitis, unspecified: Secondary | ICD-10-CM

## 2014-10-30 MED ORDER — DOXYCYCLINE HYCLATE 100 MG PO TABS: 100.0000 mg | ORAL_TABLET | Freq: Two times a day (BID) | ORAL | Status: DC

## 2014-10-30 NOTE — Progress Notes (Signed)
   Subjective:    Patient ID: Katrina Dean, female    DOB: February 02, 1951, 64 y.o.   MRN: [id number]  Cough This is a recurrent problem. Episode onset: Seen here on 01/08. Started back again last Wed. The problem has been gradually worsening. The problem occurs every few minutes. The cough is productive of purulent sputum. Associated symptoms include rhinorrhea and wheezing. The symptoms are aggravated by lying down. Risk factors for lung disease include travel. She has tried steroid inhaler (finised amox) for the symptoms. The treatment provided mild relief.    Started last wk when exposed to perfume  yest noted sneezing and coughing an dy ell phlegm    Review of Systems  HENT: Positive for rhinorrhea.   Respiratory: Positive for cough and wheezing.        Objective:   Physical Exam  Alert moderate malaise. Vital stable HEENT moderate nasal frontal tenderness pharynx normal lungs trace wheeze heart regular in rhythm.      Assessment & Plan:  Impression rhinosinusitis/bronchitis with flare of reactive airways plan Ventolin when necessary. Doxy 100 twice a day 10 days. Symptomatic care discussed. Warning signs discussed. WSL

## 2014-12-04 ENCOUNTER — Telehealth: Payer: Self-pay | Admitting: Family Medicine

## 2014-12-04 DIAGNOSIS — R5383 Other fatigue: Secondary | ICD-10-CM

## 2014-12-04 DIAGNOSIS — M8580 Other specified disorders of bone density and structure, unspecified site: Secondary | ICD-10-CM

## 2014-12-04 DIAGNOSIS — Z79899 Other long term (current) drug therapy: Secondary | ICD-10-CM

## 2014-12-04 DIAGNOSIS — E785 Hyperlipidemia, unspecified: Secondary | ICD-10-CM

## 2014-12-04 NOTE — Telephone Encounter (Signed)
Patient has physical with Hoyle Sauer on 3/9 and needing lab done.

## 2014-12-05 NOTE — Telephone Encounter (Signed)
Pt notified and verbalized understanding to go to [facility] and be fasting. 

## 2014-12-05 NOTE — Telephone Encounter (Signed)
Please order lipid profile, liver profile, Met 7, TSH and vitamin D level. Diagnosis include: hyperlipidemia and vitamin D insufficiency. Thanks.

## 2014-12-05 NOTE — Addendum Note (Signed)
Addended byCharolotte Capuchin D on: 12/05/2014 02:46 PM   Modules accepted: Orders

## 2014-12-08 LAB — LIPID PANEL
CHOLESTEROL TOTAL: 273 mg/dL — AB (ref 100–199)
Chol/HDL Ratio: 5.2 ratio units — ABNORMAL HIGH (ref 0.0–4.4)
HDL: 53 mg/dL (ref >39)
LDL CALC: 194 mg/dL — AB (ref 0–99)
Triglycerides: 128 mg/dL (ref 0–149)
VLDL Cholesterol Cal: 26 mg/dL (ref 5–40)

## 2014-12-08 LAB — HEPATIC FUNCTION PANEL
ALK PHOS: 64 IU/L (ref 39–117)
ALT: 11 IU/L (ref 0–32)
AST: 16 IU/L (ref 0–40)
Albumin: 4.5 g/dL (ref 3.6–4.8)
BILIRUBIN TOTAL: 0.5 mg/dL (ref 0.0–1.2)
Bilirubin, Direct: 0.1 mg/dL (ref 0.00–0.40)
Total Protein: 7.1 g/dL (ref 6.0–8.5)

## 2014-12-08 LAB — TSH: TSH: 1.62 u[IU]/mL (ref 0.450–4.500)

## 2014-12-08 LAB — BASIC METABOLIC PANEL
BUN / CREAT RATIO: 10 — AB (ref 11–26)
BUN: 9 mg/dL (ref 8–27)
CO2: 23 mmol/L (ref 18–29)
CREATININE: 0.91 mg/dL (ref 0.57–1.00)
Calcium: 9.8 mg/dL (ref 8.7–10.3)
Chloride: 100 mmol/L (ref 97–108)
GFR calc non Af Amer: 68 mL/min/{1.73_m2} (ref >59)
GFR, EST AFRICAN AMERICAN: 78 mL/min/{1.73_m2} (ref >59)
GLUCOSE: 97 mg/dL (ref 65–99)
POTASSIUM: 4.4 mmol/L (ref 3.5–5.2)
SODIUM: 139 mmol/L (ref 134–144)

## 2014-12-08 LAB — VITAMIN D 25 HYDROXY (VIT D DEFICIENCY, FRACTURES): Vit D, 25-Hydroxy: 22.9 ng/mL — ABNORMAL LOW (ref 30.0–100.0)

## 2014-12-12 ENCOUNTER — Encounter: Payer: Self-pay | Admitting: Nurse Practitioner

## 2014-12-12 ENCOUNTER — Ambulatory Visit (INDEPENDENT_AMBULATORY_CARE_PROVIDER_SITE_OTHER): Payer: 59 | Admitting: Nurse Practitioner

## 2014-12-12 VITALS — BP 122/80 | Ht 63.0 in | Wt 154.4 lb

## 2014-12-12 DIAGNOSIS — Z0000 Encounter for general adult medical examination without abnormal findings: Secondary | ICD-10-CM | POA: Diagnosis not present

## 2014-12-12 DIAGNOSIS — E785 Hyperlipidemia, unspecified: Secondary | ICD-10-CM

## 2014-12-12 MED ORDER — PRAVASTATIN SODIUM 20 MG PO TABS: 20.0000 mg | ORAL_TABLET | Freq: Every day | ORAL | Status: DC

## 2014-12-12 NOTE — Patient Instructions (Signed)
Virtual colonoscopy

## 2014-12-15 ENCOUNTER — Encounter: Payer: Self-pay | Admitting: Nurse Practitioner

## 2014-12-15 NOTE — Progress Notes (Signed)
   Subjective:    Patient ID: Katrina Dean, female    DOB: 07-24-1951, 64 y.o.   MRN: [id number]  HPI presents for her wellness exam. Had TAH and BSO for bleeding. Same sexual partner. Regular vision and dental exams. Regular walking program. Doing well with diet.     Review of Systems  Constitutional: Negative for fever, activity change, appetite change and fatigue.  HENT: Negative for dental problem, ear pain, sinus pressure and sore throat.   Respiratory: Negative for cough, chest tightness, shortness of breath and wheezing.   Cardiovascular: Negative for chest pain.  Gastrointestinal: Negative for nausea, vomiting, abdominal pain, diarrhea, constipation, blood in stool and abdominal distention.  Genitourinary: Negative for dysuria, urgency, frequency, vaginal discharge, enuresis, difficulty urinating, genital sores and pelvic pain.       Objective:   Physical Exam  Constitutional: She is oriented to person, place, and time. She appears well-developed. No distress.  HENT:  Right Ear: External ear normal.  Left Ear: External ear normal.  Mouth/Throat: Oropharynx is clear and moist.  Neck: Normal range of motion. Neck supple. No tracheal deviation present. No thyromegaly present.  Cardiovascular: Normal rate, regular rhythm and normal heart sounds.  Exam reveals no gallop.   No murmur heard. Pulmonary/Chest: Effort normal and breath sounds normal.  Abdominal: Soft. She exhibits no distension. There is no tenderness.  Genitourinary: Vagina normal. No vaginal discharge found.  External GU: no rashes or lesions. Vagina no discharge. Rectal exam: no masses; no stool for hemoccult.   Musculoskeletal: She exhibits no edema.  Lymphadenopathy:    She has no cervical adenopathy.  Neurological: She is alert and oriented to person, place, and time.  Skin: Skin is warm and dry. No rash noted.  Psychiatric: She has a normal mood and affect. Her behavior is normal.  Vitals  reviewed. breast exam: no masses; axillae no adenopathy. 12/07/14 LDL 194 up from 176.       Assessment & Plan:   Problem List Items Addressed This Visit      Other   Hyperlipemia   Relevant Medications   pravastatin (PRAVACHOL) tablet    Other Visit Diagnoses    Routine general medical examination at a health care facility    -  Primary    Relevant Orders    Lipid panel    Hepatic function panel    POC Hemoccult Bld/Stl (3-Cd Home Screen)    Hyperlipidemia        Relevant Medications    pravastatin (PRAVACHOL) tablet    Other Relevant Orders    Lipid panel    Hepatic function panel      Meds ordered this encounter  Medications  . Calcium Carbonate (CALTRATE 600 PO)    Sig: Take by mouth.  Marland Kitchen KRILL OIL PO    Sig: Take by mouth.  . pravastatin (PRAVACHOL) 20 MG tablet    Sig: Take 1 tablet (20 mg total) by mouth daily.    Dispense:  30 tablet    Refill:  2    Order Specific Question:  Supervising Provider    Answer:  Mikey Kirschner [[id number]]   Strongly recommend medication for lipids. Patient agrees to start statin. Repeat labs in 8-10 weeks. Given Rx and info for Zostavax. Given info on colonoscopy. Recommend daily calcium and Vitamin D.  Return in about 6 months (around 06/14/2015).

## 2015-02-26 ENCOUNTER — Telehealth: Payer: Self-pay | Admitting: Family Medicine

## 2015-02-26 NOTE — Telephone Encounter (Signed)
Pt states her insurance requires a referral to get her next mammogram  Can we please do this through [facility]

## 2015-02-27 ENCOUNTER — Other Ambulatory Visit: Payer: Self-pay

## 2015-02-27 DIAGNOSIS — Z1231 Encounter for screening mammogram for malignant neoplasm of breast: Secondary | ICD-10-CM

## 2015-02-27 NOTE — Telephone Encounter (Signed)
Pt called and scheduled her own mammo. Pt found out she does not need a referral. Test has been scheduled 04/01/15. Pt is aware of appt.

## 2015-02-27 NOTE — Telephone Encounter (Signed)
Please order. Thanks.

## 2015-04-01 ENCOUNTER — Ambulatory Visit
Admission: RE | Admit: 2015-04-01 | Discharge: 2015-04-01 | Disposition: A | Discharge status: Home / Self Care | Payer: 59 | Source: Ambulatory Visit

## 2015-04-01 DIAGNOSIS — Z1231 Encounter for screening mammogram for malignant neoplasm of breast: Secondary | ICD-10-CM

## 2015-04-26 ENCOUNTER — Encounter: Payer: Self-pay | Admitting: Family Medicine

## 2015-04-26 ENCOUNTER — Ambulatory Visit (INDEPENDENT_AMBULATORY_CARE_PROVIDER_SITE_OTHER): Payer: 59 | Admitting: Family Medicine

## 2015-04-26 VITALS — BP 124/80 | Temp 98.7°F | Ht 64.0 in | Wt 157.0 lb

## 2015-04-26 DIAGNOSIS — J019 Acute sinusitis, unspecified: Secondary | ICD-10-CM

## 2015-04-26 DIAGNOSIS — B9689 Other specified bacterial agents as the cause of diseases classified elsewhere: Secondary | ICD-10-CM

## 2015-04-26 MED ORDER — CEFPROZIL 500 MG PO TABS: 500.0000 mg | ORAL_TABLET | Freq: Two times a day (BID) | ORAL | Status: DC

## 2015-04-26 MED ORDER — ALBUTEROL SULFATE HFA 108 (90 BASE) MCG/ACT IN AERS: 2.0000 | INHALATION_SPRAY | Freq: Four times a day (QID) | RESPIRATORY_TRACT | Status: DC | PRN

## 2015-04-26 NOTE — Progress Notes (Signed)
   Subjective:    Patient ID: Katrina Dean, female    DOB: Mar 12, 1951, 64 y.o.   MRN: [id number]  Cough This is a new problem. Episode onset: 1 week ago. Associated symptoms include rhinorrhea. Pertinent negatives include no chest pain, ear pain, fever, shortness of breath or wheezing. Associated symptoms comments: Congestion, body aches, runny nose, and ringing in ears. Treatments tried: goody powder and robutissum.    PMH benign no longer smokes denies hemoptysis  Review of Systems  Constitutional: Negative for fever and activity change.  HENT: Positive for congestion and rhinorrhea. Negative for ear pain.   Eyes: Negative for discharge.  Respiratory: Positive for cough. Negative for shortness of breath and wheezing.   Cardiovascular: Negative for chest pain.       Objective:   Physical Exam  Constitutional: She appears well-developed.  HENT:  Head: Normocephalic.  Nose: Nose normal.  Mouth/Throat: Oropharynx is clear and moist. No oropharyngeal exudate.  Neck: Neck supple.  Cardiovascular: Normal rate and normal heart sounds.   No murmur heard. Pulmonary/Chest: Effort normal and breath sounds normal. She has no wheezes.  Lymphadenopathy:    She has no cervical adenopathy.  Skin: Skin is warm and dry.  Nursing note and vitals reviewed.         Assessment & Plan:  Treated today for sinus infection and bronchitis antibiotics new inhaler given she still no longer smokes which is good news follow-up if progressive troubles  Patient worried about cancer she does not have any specific symptoms is just worried she has every now and then we talked about the importance of keeping up-to-date on all her health parameters and if she has specific symptoms follow-up for further workup

## 2015-04-26 NOTE — Patient Instructions (Signed)
Dear Patient,  It has been recommended to you that you have a colonoscopy. It is your responsibility to carry through with this recommendation.   Did you realize that colon cancer is the second leading cancer killer in the [place]. One in every 20 adults will get colon cancer. If all adults would go through the recommended screening for colon cancer (getting a colonoscopy), then there would be a 60% reduction in the number of people dying from colon cancer.  Colon cancer just doesn't come out of the blue. It starts off as a small polyp which over time grows into a cancer. A colonoscopy can prevent cancer and in many cases detected when it is at a very treatable phase. Small colon cancers can have cure rates of 95%. Advanced colon cancer, which often occurs in people who do not do their screenings, have cure rates less than 20%. The risk of colon cancer advances with age. Most adults should have regular colonoscopies every 10 years starting at age 50. This recommendation can vary depending on a person's medical history.  Health-care laws now allow for you to call the gastroenterologist office directly in order to set yourself up for this very important tests. Today we have recommended to you that you do this test. This test may save your life. Failure to do this test puts you at risk for premature death from colon cancer. Do the right thing and schedule this test now.  Here as a list of specialists we recommend in the surrounding area. When you call their office let them know that you are a patient of our practice in your interested in doing a screening colonoscopy. They should assist you without problems. You will need the following information when you called them: 1-name of which Dr. you see, 2-your insurance information, 3-a list of medications that you currently take, 4-any allergies you have to medications.  [place] gastroenterologist Dr. Mike Rourk, Dr Sandi Fields   [place]  gastroenterologist   [phone]  Dr.Najeeb Rehman [facility]   [phone]  [place] [facility] (Dr. Perry, N, Stark, Brodie, Gesner, Jacobs and Pyrtle) [phone]  [facility] (Dr. Buscemi, Edwards, Hayes, Maygod,Outlaw,Schooler) [phone]  Each group of specialists has assured us that when you called them they will help you get your colonoscopy set up. Should you have problems please let us know. Be sure to call soon. Sincerely, Carolyn Hoskins, Dr Steve Luking, Dr.Scott Luking    

## 2015-10-15 ENCOUNTER — Other Ambulatory Visit: Payer: Self-pay | Admitting: Family Medicine

## 2015-10-15 NOTE — Telephone Encounter (Signed)
May have this and one refill, needs office visit before further scripts

## 2015-10-16 ENCOUNTER — Telehealth: Payer: Self-pay | Admitting: Family Medicine

## 2015-10-16 ENCOUNTER — Other Ambulatory Visit: Payer: Self-pay | Admitting: Family Medicine

## 2015-10-16 NOTE — Telephone Encounter (Signed)
Pt wants to know if she can get some clonazePAM (KLONOPIN) 0.5 MG tablet  Sent to [facility]

## 2015-10-16 NOTE — Telephone Encounter (Signed)
Called patient and informed her refill sent into pharmacy this morning via rx request. Patient verbalized understanding.

## 2015-12-26 ENCOUNTER — Encounter: Payer: Self-pay | Admitting: Gastroenterology

## 2015-12-26 ENCOUNTER — Telehealth: Payer: Self-pay | Admitting: Family Medicine

## 2015-12-26 ENCOUNTER — Encounter: Payer: Self-pay | Admitting: Family Medicine

## 2015-12-26 ENCOUNTER — Ambulatory Visit (INDEPENDENT_AMBULATORY_CARE_PROVIDER_SITE_OTHER): Payer: BLUE CROSS/BLUE SHIELD | Admitting: Family Medicine

## 2015-12-26 VITALS — BP 138/90 | HR 89 | Temp 98.1°F | Ht 63.39 in | Wt 163.1 lb

## 2015-12-26 DIAGNOSIS — J309 Allergic rhinitis, unspecified: Secondary | ICD-10-CM

## 2015-12-26 DIAGNOSIS — J302 Other seasonal allergic rhinitis: Secondary | ICD-10-CM

## 2015-12-26 DIAGNOSIS — K219 Gastro-esophageal reflux disease without esophagitis: Secondary | ICD-10-CM

## 2015-12-26 DIAGNOSIS — Z9109 Other allergy status, other than to drugs and biological substances: Secondary | ICD-10-CM

## 2015-12-26 DIAGNOSIS — Z23 Encounter for immunization: Secondary | ICD-10-CM

## 2015-12-26 DIAGNOSIS — R6889 Other general symptoms and signs: Secondary | ICD-10-CM | POA: Diagnosis not present

## 2015-12-26 DIAGNOSIS — M8580 Other specified disorders of bone density and structure, unspecified site: Secondary | ICD-10-CM

## 2015-12-26 DIAGNOSIS — Z0000 Encounter for general adult medical examination without abnormal findings: Secondary | ICD-10-CM | POA: Diagnosis not present

## 2015-12-26 DIAGNOSIS — Z0001 Encounter for general adult medical examination with abnormal findings: Secondary | ICD-10-CM | POA: Diagnosis not present

## 2015-12-26 DIAGNOSIS — E785 Hyperlipidemia, unspecified: Secondary | ICD-10-CM

## 2015-12-26 DIAGNOSIS — Z91048 Other nonmedicinal substance allergy status: Secondary | ICD-10-CM | POA: Diagnosis not present

## 2015-12-26 DIAGNOSIS — E739 Lactose intolerance, unspecified: Secondary | ICD-10-CM

## 2015-12-26 LAB — LIPID PANEL
Cholesterol: 217 mg/dL — ABNORMAL HIGH (ref 0–200)
HDL: 49.5 mg/dL (ref >39.00)
LDL Cholesterol: 150 mg/dL — ABNORMAL HIGH (ref 0–99)
NonHDL: 167.48
Total CHOL/HDL Ratio: 4
Triglycerides: 88 mg/dL (ref 0.0–149.0)
VLDL: 17.6 mg/dL (ref 0.0–40.0)

## 2015-12-26 LAB — CBC WITH DIFFERENTIAL/PLATELET
Basophils Absolute: 0 10*3/uL (ref 0.0–0.1)
Basophils Relative: 0.4 % (ref 0.0–3.0)
EOS PCT: 0.8 % (ref 0.0–5.0)
Eosinophils Absolute: 0 10*3/uL (ref 0.0–0.7)
HCT: 42 % (ref 36.0–46.0)
HEMOGLOBIN: 14.1 g/dL (ref 12.0–15.0)
LYMPHS ABS: 1.9 10*3/uL (ref 0.7–4.0)
Lymphocytes Relative: 38.4 % (ref 12.0–46.0)
MCHC: 33.5 g/dL (ref 30.0–36.0)
MCV: 91.8 fl (ref 78.0–100.0)
MONO ABS: 0.3 10*3/uL (ref 0.1–1.0)
Monocytes Relative: 5.9 % (ref 3.0–12.0)
NEUTROS PCT: 54.5 % (ref 43.0–77.0)
Neutro Abs: 2.7 10*3/uL (ref 1.4–7.7)
Platelets: 194 10*3/uL (ref 150.0–400.0)
RBC: 4.58 Mil/uL (ref 3.87–5.11)
RDW: 13.3 % (ref 11.5–15.5)
WBC: 4.9 10*3/uL (ref 4.0–10.5)

## 2015-12-26 LAB — COMPREHENSIVE METABOLIC PANEL
ALBUMIN: 4.3 g/dL (ref 3.5–5.2)
ALK PHOS: 57 U/L (ref 39–117)
ALT: 15 U/L (ref 0–35)
AST: 18 U/L (ref 0–37)
BUN: 12 mg/dL (ref 6–23)
CO2: 26 mEq/L (ref 19–32)
CREATININE: 0.77 mg/dL (ref 0.40–1.20)
Calcium: 9.4 mg/dL (ref 8.4–10.5)
Chloride: 106 mEq/L (ref 96–112)
GFR: 80.38 mL/min (ref >60.00)
GLUCOSE: 93 mg/dL (ref 70–99)
Potassium: 4.3 mEq/L (ref 3.5–5.1)
SODIUM: 141 meq/L (ref 135–145)
TOTAL PROTEIN: 7.3 g/dL (ref 6.0–8.3)
Total Bilirubin: 0.4 mg/dL (ref 0.2–1.2)

## 2015-12-26 NOTE — Patient Instructions (Addendum)
It was a pleasure meeting you today! Your lab results will be communicated to you within one week or sooner if needed.  You may use flonase for nasal symptoms and can choose either Allegra, Claritin, or Zyrtec as needed for allergic symptoms. If you are using your inhaler on a regular basis or more than once/week, please follow up for further evaluation and management of symptoms.  Allergies An allergy is when your body reacts to a substance in a way that is not normal. An allergic reaction can happen after you:  Eat something.  Breathe in something.  Touch something. WHAT KINDS OF ALLERGIES ARE THERE? You can be allergic to:  Things that are only around during certain seasons, like molds and pollens.  Foods.  Drugs.  Insects.  Animal dander. WHAT ARE SYMPTOMS OF ALLERGIES?  Puffiness (swelling). This may happen on the lips, face, tongue, mouth, or throat.  Sneezing.  Coughing.  Breathing loudly (wheezing).  Stuffy nose.  Tingling in the mouth.  A rash.  Itching.  Itchy, red, puffy areas of skin (hives).  Watery eyes.  Throwing up (vomiting).  Watery poop (diarrhea).  Dizziness.  Feeling faint or fainting.  Trouble breathing or swallowing.  A tight feeling in the chest.  A fast heartbeat. HOW ARE ALLERGIES DIAGNOSED? Allergies can be diagnosed with:  A medical and family history.  Skin tests.  Blood tests.  A food diary. A food diary is a record of all the foods, drinks, and symptoms you have each day.  The results of an elimination diet. This diet involves making sure not to eat certain foods and then seeing what happens when you start eating them again. HOW ARE ALLERGIES TREATED? There is no cure for allergies, but allergic reactions can be treated with medicine. Severe reactions usually need to be treated at a hospital.  HOW CAN REACTIONS BE PREVENTED? The best way to prevent an allergic reaction is to avoid the thing you are allergic to.  Allergy shots and medicines can also help prevent reactions in some cases.   This information is not intended to replace advice given to you by your health care provider. Make sure you discuss any questions you have with your health care provider.   Document Released: 01/16/2013 Document Revised: 10/12/2014 Document Reviewed: 07/03/2014 Elsevier Interactive Patient Education [employer].

## 2015-12-26 NOTE — Progress Notes (Signed)
Patient ID: Katrina Dean, female   DOB: Apr 09, 1951, 65 y.o.   MRN: [id number]     Patient presents to clinic today to establish care.  Acute Concerns Presents today with itchy, watery eyes, rhinitis, and notes a history of seasonal and environmental allergies for 1-2 weeks. She reports using her albuterol inhaler 2 x over the past week with moderate benefit. She denies history of asthma but has noted bronchitis previously. Associated symptoms of cough that is rarely productive of white sputum, ear pressure and sinus pressure/pain. Treatments at home include OTC cough medicine, and Goody's powder with limited benefit Pertinent history includes working outside with chickens and other animals. Patient notes an increase in allergic rhinitis but has not tried any treatments at this time.  Patient reports a "sore" on her tongue that she noticed after biting her tongue and burning her mouth with coffee 1 week ago. She denies pain, burning sensation, or bleeding.  Chronic Issues: Osteopenia: Vitamin D 1200mg /day but denies taking Calcium supplement because she ran out.  Reflux: Controlled with famotidine and limiting triggers. With limitation of triggers, medication is not required Lactose Intolerant Hyperlipidemia: Uses krill oil but does not follow a particular diet  Health Maintenance: Dental -- Has not seen a dentist in 4-5 years due to financial concerns and no dental insurance Vision -- UTD, sees provider yearly Immunizations -- Needs tetanus, declines flu vaccine Colonoscopy -- Needs to schedule Mammogram -- Due 03/31/2017 PAP -- Total hysterectomy in her late 25s, no pap needed Bone Density -- Last bone density was 02/21/2013. Osteopenia (preosteoporosis) in hip/femur noted in report. Patient was advised to start either fosamax or Boniva but declined to do so in 2014.   Diet:  Does not follow a particular diet. Vegetables and water are included in her diet. No caffeine intake Exercise:   Does not follow an exercise program at this point Patient reports using 1/4 of the 0.5 mg. Clonazepam dose only 2-3 times/ year for sleep disturbance.   She denies depression or anxious mood but notes that her mother passed away within the past year and she has experienced some situational sadness. She denies any suicidal ideation or plan and notes that her mood is improved at this time.   Past Medical History  Diagnosis Date  . Osteopenia 2010    Past Surgical History  Procedure Laterality Date  . Abdominal hysterectomy    . Tonsillectomy and adenoidectomy    . Laparoscopic bilateral salpingo oopherectomy      Current Outpatient Prescriptions on File Prior to Visit  Medication Sig Dispense Refill  . albuterol (PROVENTIL HFA;VENTOLIN HFA) 108 (90 BASE) MCG/ACT inhaler Inhale 2 puffs into the lungs every 6 (six) hours as needed for wheezing or shortness of breath. 1 Inhaler 6  . Calcium Carbonate (CALTRATE 600 PO) Take by mouth.    . clonazePAM (KLONOPIN) 0.5 MG tablet TAKE ONE TABLET BY MOUTH TWICE DAILY AS NEEDED 30 tablet 1  . KRILL OIL PO Take by mouth.    . Probiotic Product (PROBIOTIC DAILY PO) Take by mouth.     No current facility-administered medications on file prior to visit.    Allergies  Allergen Reactions  . Erythromycin Nausea And Vomiting  . Penicillins Itching and Rash    Has taken cephalosporin  . Hydrocodone Nausea Only  . Levaquin [Levofloxacin In D5w]     Arms/shoulder pain  . Prednisone     Mood swings Agitation  . Sulfur     Family History  Problem Relation Age of Onset  . Cancer Father   . Cancer Maternal Uncle   . Diabetes Paternal Uncle   . Cancer Maternal Grandfather   . Diabetes Paternal Grandfather   . Osteoporosis Mother   . Osteoporosis Maternal Grandmother   . Hypertension Paternal Grandmother     Social History   Social History  . Marital Status: Married    Spouse Name: N/A  . Number of Children: N/A  . Years of Education:  N/A   Occupational History  . Not on file.   Social History Main Topics  . Smoking status: Former Smoker -- 0.50 packs/day for 5 years    Types: Cigarettes  . Smokeless tobacco: Never Used  . Alcohol Use: No  . Drug Use: No  . Sexual Activity: Yes    Birth Control/ Protection: Surgical   Other Topics Concern  . Not on file   Social History Narrative    Review of Systems  Constitutional: Negative for fever, chills, weight loss and malaise/fatigue.  HENT: Positive for congestion. Negative for ear pain, hearing loss, nosebleeds, sore throat and tinnitus.        Prior history of migraines reports less than <4 year  Eyes: Negative for blurred vision, double vision, pain, discharge and redness.  Respiratory: Positive for cough. Negative for shortness of breath and wheezing.   Cardiovascular: Negative for chest pain, palpitations, claudication, leg swelling and PND.  Gastrointestinal: Negative for heartburn, nausea, vomiting, abdominal pain, diarrhea and blood in stool.  Genitourinary: Negative for dysuria, urgency, frequency, hematuria and flank pain.  Musculoskeletal: Negative for myalgias, back pain and joint pain.  Skin: Negative for rash.  Neurological: Negative for tingling, weakness and headaches.  Endo/Heme/Allergies: Positive for environmental allergies. Negative for polydipsia. Does not bruise/bleed easily.  Psychiatric/Behavioral: Negative for depression, suicidal ideas, hallucinations and substance abuse. The patient is not nervous/anxious.        Denies depressed or anxious mood.     BP 138/90 mmHg  Pulse 89  Temp(Src) 98.1 F (36.7 C) (Oral)  Ht 5' 3.39" (1.61 m)  Wt 163 lb 1.6 oz (73.982 kg)  BMI 28.54 kg/m2  Physical Exam  Constitutional: She is oriented to person, place, and time. She appears well-developed and well-nourished. No distress.  HENT:   Head: Normocephalic and atraumatic.  Right Ear: External ear normal.  Left Ear: External ear normal.     Mouth/Throat: Oropharynx is clear and moist. 1-2 mm raised area on lateral area of tongue that appears to be related to trauma Eyes: Conjunctivae and EOM are normal. Pupils are equal, round, and reactive to light. No scleral icterus.  Neck: Neck supple. No thyromegaly present.  Cardiovascular: Normal rate, regular rhythm, normal heart sounds and intact distal pulses.    No murmur heard. Pulmonary/Chest: Effort normal and breath sounds normal. She has no wheezes.  Abdominal: Soft. Bowel sounds are normal. She exhibits no mass. There is no rebound.  Musculoskeletal: She exhibits no edema.  Neurological: She is alert and oriented to person, place, and time. No cranial nerve deficit.  Skin: Skin is warm and dry.  Psychiatric: She has a normal mood and affect. Her behavior is normal.  Assessment/Plan:  1. Environmental allergies  2. Seasonal allergies Advised use of Flonase and either Allegra, Claritin, or Zyrtec as needed.  If allergic symptoms persist, worsen, or do not improve with treatment a referral to an allergist will be considered  3. Allergic rhinitis, unspecified allergic rhinitis type   4. Osteopenia Continue  calcium supplementation with a goal of 1200mg /day and Vitamin D goal of 800mg /day. Will review lab result and advise if any changes are needed. - VITAMIN D 25 Hydroxy (Vit-D Deficiency, Fractures)  5. Hyperlipidemia Advised patient to follow a heart healthy diet. Patient reports krill oil use and will continue this along with dietary improvements.  - Comprehensive metabolic panel - CBC with Differential/Platelet - Lipid panel  6. Visit for preventive health examination  - Comprehensive metabolic panel - CBC with Differential/Platelet - Lipid panel - TSH - Hepatitis C antibody - DG Bone Density; Future - Hemoglobin A1c - Ambulatory referral to Gastroenterology  7. Need for prophylactic vaccination with combined diphtheria-tetanus-pertussis (DTP) vaccine  -  Tdap vaccine greater than or equal to 7yo IM  8. Lactose intolerance   9. Gastroesophageal reflux disease without esophagitis  Advised patient to follow a heart healthy diet and discussed the mediterranean diet and low sodium need with her today. Also advised patient to monitor area on tongue for changes and follow up with dentist if this area does not resolve in 1-2 weeks. She voiced understanding and agreed with plan. Follow up appointment will be based upon lab results

## 2015-12-26 NOTE — Addendum Note (Signed)
Addended by: Delano Metz A on: 12/26/2015 01:02 PM   Modules accepted: Level of Service

## 2015-12-26 NOTE — Telephone Encounter (Signed)
GI referral made for preventive screening colonscopy. Bone density screening ordered also at the [facility] location. Advise patient to continue Vitamin D and Calcium supplementation. Recommend 1200mg  Calcium and 800 IU Vitamin D and will evaluate lab values and adjust doses if necessary.

## 2015-12-26 NOTE — Progress Notes (Signed)
Pre visit review using our clinic review tool, if applicable. No additional management support is needed unless otherwise documented below in the visit note. 

## 2015-12-27 LAB — TSH: TSH: 1.21 u[IU]/mL (ref 0.35–4.50)

## 2015-12-27 LAB — VITAMIN D 25 HYDROXY (VIT D DEFICIENCY, FRACTURES): VITD: 24.06 ng/mL — AB (ref 30.00–100.00)

## 2015-12-27 LAB — HEPATITIS C ANTIBODY: HCV Ab: NEGATIVE

## 2015-12-30 NOTE — Telephone Encounter (Signed)
Spoke with pt and shes aware

## 2015-12-31 ENCOUNTER — Telehealth: Payer: Self-pay | Admitting: Family Medicine

## 2015-12-31 NOTE — Telephone Encounter (Signed)
Patient advised to take Vitamin D 800 IU daily. She voiced understanding and agreed with plan. Bone density scan has been recommended for patient.

## 2016-02-13 ENCOUNTER — Encounter: Payer: BLUE CROSS/BLUE SHIELD | Admitting: Gastroenterology

## 2016-05-04 ENCOUNTER — Telehealth: Payer: Self-pay | Admitting: Family Medicine

## 2016-05-04 NOTE — Telephone Encounter (Signed)
albuterol (PROVENTIL HFA;VENTOLIN HFA) 108 (90 BASE) MCG/ACT inhaler  clonazePAM (KLONOPIN) 0.5 MG tablet   [facility] refill please, having to change pharmacies

## 2016-05-04 NOTE — Telephone Encounter (Signed)
Patient stated she wants Dr Nicki Reaper to be her primary doctor and scheduled an office visit for a med check. Patient will get refills at office visit.

## 2016-05-04 NOTE — Telephone Encounter (Signed)
im puzzled went to [facility] [place] in march see note, who is doing primary care??

## 2016-05-13 ENCOUNTER — Encounter: Payer: Self-pay | Admitting: Family Medicine

## 2016-05-13 ENCOUNTER — Other Ambulatory Visit: Payer: Self-pay | Admitting: Family Medicine

## 2016-05-13 ENCOUNTER — Ambulatory Visit (INDEPENDENT_AMBULATORY_CARE_PROVIDER_SITE_OTHER): Payer: BLUE CROSS/BLUE SHIELD | Admitting: Family Medicine

## 2016-05-13 VITALS — BP 138/86 | Ht 64.0 in | Wt 156.6 lb

## 2016-05-13 DIAGNOSIS — M8580 Other specified disorders of bone density and structure, unspecified site: Secondary | ICD-10-CM | POA: Diagnosis not present

## 2016-05-13 DIAGNOSIS — J309 Allergic rhinitis, unspecified: Secondary | ICD-10-CM | POA: Diagnosis not present

## 2016-05-13 DIAGNOSIS — Z1231 Encounter for screening mammogram for malignant neoplasm of breast: Secondary | ICD-10-CM

## 2016-05-13 MED ORDER — ALBUTEROL SULFATE HFA 108 (90 BASE) MCG/ACT IN AERS: 2.0000 | INHALATION_SPRAY | Freq: Four times a day (QID) | RESPIRATORY_TRACT | 6 refills | Status: DC | PRN

## 2016-05-13 MED ORDER — CLONAZEPAM 0.5 MG PO TABS: 0.5000 mg | ORAL_TABLET | Freq: Two times a day (BID) | ORAL | 3 refills | Status: DC | PRN

## 2016-05-13 NOTE — Patient Instructions (Signed)
Dear Patient,  It has been recommended to you that you have a colonoscopy. It is your responsibility to carry through with this recommendation.   Did you realize that colon cancer is the second leading cancer killer in the [place]. One in every 20 adults will get colon cancer. If all adults would go through the recommended screening for colon cancer (getting a colonoscopy), then there would be a 60% reduction in the number of people dying from colon cancer.  Colon cancer just doesn't come out of the blue. It starts off as a small polyp which over time grows into a cancer. A colonoscopy can prevent cancer and in many cases detected when it is at a very treatable phase. Small colon cancers can have cure rates of 95%. Advanced colon cancer, which often occurs in people who do not do their screenings, have cure rates less than 20%. The risk of colon cancer advances with age. Most adults should have regular colonoscopies every 10 years starting at age 50. This recommendation can vary depending on a person's medical history.  Health-care laws now allow for you to call the gastroenterologist office directly in order to set yourself up for this very important tests. Today we have recommended to you that you do this test. This test may save your life. Failure to do this test puts you at risk for premature death from colon cancer. Do the right thing and schedule this test now.  Here as a list of specialists we recommend in the surrounding area. When you call their office let them know that you are a patient of our practice in your interested in doing a screening colonoscopy. They should assist you without problems. You will need the following information when you called them: 1-name of which Dr. you see, 2-your insurance information, 3-a list of medications that you currently take, 4-any allergies you have to medications.  [place] gastroenterologist Dr. Mike Rourk, Dr Sandi Fields   [place]  gastroenterologist   [phone]  Dr.Najeeb Rehman [place]   [phone]  [place] [facility] (Dr. Perry, N, Stark, Brodie, Gesner, Jacobs and Pyrtle) [phone]  [facility] (Dr. Buscemi, Edwards, Hayes, Maygod,Outlaw,Schooler) [phone]  Each group of specialists has assured us that when you called them they will help you get your colonoscopy set up. Should you have problems or if the GI practice insist a referral be done please let us know. Be sure to call soon. Sincerely, Carolyn Hoskins, Dr Steve Aaniya Sterba, Dr.Alastor Kneale    

## 2016-05-13 NOTE — Progress Notes (Signed)
   Subjective:    Patient ID: Katrina Dean, female    DOB: [birth year], 65 y.o.   MRN: [id number]  HPI Patient arrives for a follow up on anxiety. Patient needs refills on inhaler and klonopin. Patient states she has been having problems with allergies She does have osteopenia needs a follow-up bone density Patient denies depression Review of Systems    she denies any chest tightness pressure pain shortness of breath. Objective:   Physical Exam  Lungs clear heart regular pulse normal extremities no edema skin warm dry      Assessment & Plan:  Allergy issues-may use OTC medications Flonase when necessary  Osteopenia osteoporosis check bone density await the results of this. May need to be on other measures  Anxiety issues uses Klonopin rarely may have refill on this denies depression  Follow-up within 6-12 months sooner if any problems keep regular checkups for wellness

## 2016-05-21 ENCOUNTER — Ambulatory Visit
Admission: RE | Admit: 2016-05-21 | Discharge: 2016-05-21 | Disposition: A | Discharge status: Home / Self Care | Payer: BLUE CROSS/BLUE SHIELD | Source: Ambulatory Visit | Attending: Family Medicine | Admitting: Family Medicine

## 2016-05-21 DIAGNOSIS — Z1231 Encounter for screening mammogram for malignant neoplasm of breast: Secondary | ICD-10-CM

## 2016-05-24 ENCOUNTER — Encounter: Payer: Self-pay | Admitting: Family Medicine

## 2016-05-24 DIAGNOSIS — M810 Age-related osteoporosis without current pathological fracture: Secondary | ICD-10-CM | POA: Insufficient documentation

## 2016-06-16 ENCOUNTER — Encounter: Payer: Self-pay | Admitting: Family Medicine

## 2016-06-16 ENCOUNTER — Ambulatory Visit (INDEPENDENT_AMBULATORY_CARE_PROVIDER_SITE_OTHER): Payer: BLUE CROSS/BLUE SHIELD | Admitting: Family Medicine

## 2016-06-16 VITALS — BP 132/90 | Temp 98.4°F | Ht 64.0 in | Wt 153.0 lb

## 2016-06-16 DIAGNOSIS — J4520 Mild intermittent asthma, uncomplicated: Secondary | ICD-10-CM

## 2016-06-16 DIAGNOSIS — M810 Age-related osteoporosis without current pathological fracture: Secondary | ICD-10-CM | POA: Diagnosis not present

## 2016-06-16 DIAGNOSIS — J209 Acute bronchitis, unspecified: Secondary | ICD-10-CM | POA: Diagnosis not present

## 2016-06-16 MED ORDER — DOXYCYCLINE HYCLATE 100 MG PO TABS: 100.0000 mg | ORAL_TABLET | Freq: Two times a day (BID) | ORAL | 0 refills | Status: DC

## 2016-06-16 MED ORDER — ALENDRONATE SODIUM 70 MG PO TABS: 70.0000 mg | ORAL_TABLET | ORAL | 11 refills | Status: DC

## 2016-06-16 MED ORDER — METHYLPREDNISOLONE ACETATE 40 MG/ML IJ SUSP
40.0000 mg | Freq: Once | INTRAMUSCULAR | Status: AC
  Administered 2016-06-16: 40 mg via INTRAMUSCULAR

## 2016-06-16 NOTE — Progress Notes (Signed)
   Subjective:    Patient ID: Katrina Dean, female    DOB: 05-01-1951, 65 y.o.   MRN: [id number]  HPIpt arrives today to discuss bone density results.  15 minutes spent discussing bone density osteoporosis risk of fracture and treatment options patient opts for Fosamax oral Cough, sneezing, congestion. Started 4 days ago. Taking robutissium dm and inhaler.   Review of Systems Patient denies chest tightness pressure pain does relate some chest congestion wheezing    Objective:   Physical Exam Chest congestion wheezing noted 18 T benign overall neck no masses       Assessment & Plan:  Osteoporosis discuss in detail the importance of going ahead and starting medication in the process discussed patient agrees to the medication if it does cause esophagitis or severe reflux she will let us know repeat bone density in 2 years continue calcium and vitamin D  Reactive airway along with bronchitis/sinusitis antibiotics prescribed steroid shot given patient does not tear tolerate steroid tablet she will follow-up with [place] if worse up your all when necessary

## 2017-04-06 ENCOUNTER — Telehealth: Payer: Self-pay | Admitting: Family Medicine

## 2017-04-06 ENCOUNTER — Ambulatory Visit (INDEPENDENT_AMBULATORY_CARE_PROVIDER_SITE_OTHER): Payer: BLUE CROSS/BLUE SHIELD | Admitting: Family Medicine

## 2017-04-06 ENCOUNTER — Other Ambulatory Visit: Payer: Self-pay | Admitting: Family Medicine

## 2017-04-06 VITALS — BP 122/80 | Temp 99.3°F | Ht 64.0 in | Wt 154.0 lb

## 2017-04-06 DIAGNOSIS — J3089 Other allergic rhinitis: Secondary | ICD-10-CM | POA: Diagnosis not present

## 2017-04-06 DIAGNOSIS — J019 Acute sinusitis, unspecified: Secondary | ICD-10-CM | POA: Diagnosis not present

## 2017-04-06 DIAGNOSIS — Z79899 Other long term (current) drug therapy: Secondary | ICD-10-CM

## 2017-04-06 DIAGNOSIS — E785 Hyperlipidemia, unspecified: Secondary | ICD-10-CM

## 2017-04-06 DIAGNOSIS — J040 Acute laryngitis: Secondary | ICD-10-CM

## 2017-04-06 DIAGNOSIS — E559 Vitamin D deficiency, unspecified: Secondary | ICD-10-CM

## 2017-04-06 MED ORDER — DOXYCYCLINE HYCLATE 100 MG PO TABS: 100.0000 mg | ORAL_TABLET | Freq: Two times a day (BID) | ORAL | 0 refills | Status: DC

## 2017-04-06 MED ORDER — METHYLPREDNISOLONE ACETATE 40 MG/ML IJ SUSP
40.0000 mg | Freq: Once | INTRAMUSCULAR | Status: AC
  Administered 2017-04-06: 40 mg via INTRAMUSCULAR

## 2017-04-06 MED ORDER — ALBUTEROL SULFATE HFA 108 (90 BASE) MCG/ACT IN AERS: 2.0000 | INHALATION_SPRAY | Freq: Four times a day (QID) | RESPIRATORY_TRACT | 6 refills | Status: DC | PRN

## 2017-04-06 NOTE — Telephone Encounter (Signed)
Patient has physical in two weeks and needing labs done. No pap

## 2017-04-06 NOTE — Progress Notes (Signed)
   Subjective:    Patient ID: Katrina Dean, female    DOB: 09/09/1951, 66 y.o.   MRN: [id number]  Sinusitis  This is a new problem. The current episode started yesterday. Associated symptoms include congestion, coughing, ear pain, headaches and a sore throat. (Wheezing ) Treatments tried: cough syrup, doxy.   Patient with head congestion drainage coughing states hoarseness was outside when this first started a few days ago denies high fever chills wheezing difficulty breathing   Review of Systems  HENT: Positive for congestion, ear pain and sore throat.   Respiratory: Positive for cough.   Neurological: Positive for headaches.       Objective:   Physical Exam  Cough noted HEENT benign mount sinus tenderness hoarseness noted eardrums normal      Assessment & Plan:  Viral syndrome Environmental issues now allergy Depo-Medrol shot Antibiotics prescribed Follow-up if ongoing troubles Patient was told if hoarseness does not go away over the next 2 weeks to follow-up May need ENT referral

## 2017-04-06 NOTE — Telephone Encounter (Signed)
Patient had Lipid, cmet, hgba1c, hep c, vit d, tsh, cbc in March 2018

## 2017-04-07 NOTE — Telephone Encounter (Signed)
Lipid, liver, vitamin D, met 7

## 2017-04-08 NOTE — Telephone Encounter (Signed)
Blood work ordered in EPIC. Patient notified. 

## 2017-04-17 LAB — BASIC METABOLIC PANEL
BUN/Creatinine Ratio: 14 (ref 12–28)
BUN: 11 mg/dL (ref 8–27)
CALCIUM: 9.7 mg/dL (ref 8.7–10.3)
CHLORIDE: 101 mmol/L (ref 96–106)
CO2: 24 mmol/L (ref 20–29)
Creatinine, Ser: 0.78 mg/dL (ref 0.57–1.00)
GFR calc Af Amer: 93 mL/min/{1.73_m2} (ref >59)
GFR calc non Af Amer: 81 mL/min/{1.73_m2} (ref >59)
Glucose: 94 mg/dL (ref 65–99)
Potassium: 4.5 mmol/L (ref 3.5–5.2)
Sodium: 140 mmol/L (ref 134–144)

## 2017-04-17 LAB — LIPID PANEL
CHOL/HDL RATIO: 4 ratio (ref 0.0–4.4)
Cholesterol, Total: 226 mg/dL — ABNORMAL HIGH (ref 100–199)
HDL: 57 mg/dL (ref >39)
LDL Calculated: 152 mg/dL — ABNORMAL HIGH (ref 0–99)
Triglycerides: 87 mg/dL (ref 0–149)
VLDL Cholesterol Cal: 17 mg/dL (ref 5–40)

## 2017-04-17 LAB — HEPATIC FUNCTION PANEL
ALBUMIN: 4.5 g/dL (ref 3.6–4.8)
ALT: 10 IU/L (ref 0–32)
AST: 14 IU/L (ref 0–40)
Alkaline Phosphatase: 68 IU/L (ref 39–117)
BILIRUBIN TOTAL: 0.4 mg/dL (ref 0.0–1.2)
Bilirubin, Direct: 0.1 mg/dL (ref 0.00–0.40)
Total Protein: 7.2 g/dL (ref 6.0–8.5)

## 2017-04-17 LAB — VITAMIN D 25 HYDROXY (VIT D DEFICIENCY, FRACTURES): Vit D, 25-Hydroxy: 17.4 ng/mL — ABNORMAL LOW (ref 30.0–100.0)

## 2017-04-19 MED ORDER — VITAMIN D (ERGOCALCIFEROL) 1.25 MG (50000 UNIT) PO CAPS: 50000.0000 [IU] | ORAL_CAPSULE | ORAL | 0 refills | Status: DC

## 2017-04-19 MED ORDER — VITAMIN D 50 MCG (2000 UT) PO CAPS: ORAL_CAPSULE | ORAL | Status: DC

## 2017-04-19 NOTE — Addendum Note (Signed)
Addended by: Karle Barr on: 04/19/2017 08:45 AM   Modules accepted: Orders

## 2017-05-03 ENCOUNTER — Other Ambulatory Visit: Payer: Self-pay | Admitting: Family Medicine

## 2017-05-03 ENCOUNTER — Ambulatory Visit (INDEPENDENT_AMBULATORY_CARE_PROVIDER_SITE_OTHER): Payer: BLUE CROSS/BLUE SHIELD | Admitting: Family Medicine

## 2017-05-03 DIAGNOSIS — M816 Localized osteoporosis [Lequesne]: Secondary | ICD-10-CM | POA: Diagnosis not present

## 2017-05-03 DIAGNOSIS — Z1231 Encounter for screening mammogram for malignant neoplasm of breast: Secondary | ICD-10-CM | POA: Diagnosis not present

## 2017-05-03 DIAGNOSIS — Z13220 Encounter for screening for lipoid disorders: Secondary | ICD-10-CM | POA: Diagnosis not present

## 2017-05-03 DIAGNOSIS — Z1211 Encounter for screening for malignant neoplasm of colon: Secondary | ICD-10-CM | POA: Diagnosis not present

## 2017-05-03 DIAGNOSIS — Z1239 Encounter for other screening for malignant neoplasm of breast: Secondary | ICD-10-CM

## 2017-05-03 DIAGNOSIS — Z0000 Encounter for general adult medical examination without abnormal findings: Secondary | ICD-10-CM

## 2017-05-03 NOTE — Progress Notes (Signed)
Subjective:    Patient ID: Katrina Dean, female    DOB: Jul 23, 1951, 66 y.o.   MRN: [id number]  HPI The patient comes in today for a wellness visit.    A review of their health history was completed.  A review of medications was also completed.  Any needed refills; none  Eating habits: health conscious  Falls/  MVA accidents in past few months: no MVA, had a fall last Thursday. Tripped over dog. Right wrist is popping since the fall.   Regular exercise: yes walking  Specialist pt sees on regular basis: none  Preventative health issues were discussed.   Additional concerns: none  Pt wants mammo scheduled and hemosure test. She sees gyn and does not want pelvic or breast exam done.    Review of Systems  Constitutional: Negative for activity change, appetite change and fatigue.  HENT: Negative for congestion, ear discharge and rhinorrhea.   Eyes: Negative for discharge.  Respiratory: Negative for cough, chest tightness and wheezing.   Cardiovascular: Negative for chest pain.  Gastrointestinal: Negative for abdominal pain and vomiting.  Genitourinary: Negative for difficulty urinating and frequency.  Musculoskeletal: Negative for neck pain.  Allergic/Immunologic: Negative for environmental allergies and food allergies.  Neurological: Negative for weakness and headaches.  Psychiatric/Behavioral: Negative for agitation and behavioral problems.       Objective:   Physical Exam  Constitutional: She is oriented to person, place, and time. She appears well-developed and well-nourished.  HENT:  Head: Normocephalic.  Right Ear: External ear normal.  Left Ear: External ear normal.  Eyes: Pupils are equal, round, and reactive to light.  Neck: Normal range of motion. No thyromegaly present.  Cardiovascular: Normal rate, regular rhythm, normal heart sounds and intact distal pulses.   No murmur heard. Pulmonary/Chest: Effort normal and breath sounds normal. No respiratory  distress. She has no wheezes.  Abdominal: Soft. Bowel sounds are normal. She exhibits no distension and no mass. There is no tenderness.  Musculoskeletal: Normal range of motion. She exhibits no edema or tenderness.  Lymphadenopathy:    She has no cervical adenopathy.  Neurological: She is alert and oriented to person, place, and time. She exhibits normal muscle tone.  Skin: Skin is warm and dry.  Psychiatric: She has a normal mood and affect. Her behavior is normal.   Long discussion held with the patient regarding stress. She's actually doing better than what she was. She states she is going to be doing some marriage counseling. Her husband unfortunately has some substance abuse issues. If his situation does not improve in many ways then she may have to go to live with family in [place].  Patient denies being depressed but she states that she's gone through a lot of stress in had at times thought about hurting herself over year ago but has not at all recently. Patient does not feel depressed currently. She does find herself anxious at times. She does not want to be on any medications.  Patient does have osteoporosis I counseled her regarding medication for this she does not want to be on medicine currently she states she will think about it and discuss it with [place] on follow-up again in several months        Assessment & Plan:  Adult wellness-complete.wellness physical was conducted today. Importance of diet and exercise were discussed in detail. In addition to this a discussion regarding safety was also covered. We also reviewed over immunizations and gave recommendations regarding current immunization needed for  age. In addition to this additional areas were also touched on including: Preventative health exams needed: Colonoscopy Patient does not one a colonoscopy she understands the benefits. She does agreed to doing a stool testing for screening for colon cancer  Patient was advised  yearly wellness exam

## 2017-05-24 ENCOUNTER — Ambulatory Visit
Admission: RE | Admit: 2017-05-24 | Discharge: 2017-05-24 | Disposition: A | Discharge status: Home / Self Care | Payer: BLUE CROSS/BLUE SHIELD | Source: Ambulatory Visit | Attending: Family Medicine | Admitting: Family Medicine

## 2017-07-30 ENCOUNTER — Other Ambulatory Visit: Payer: Self-pay | Admitting: *Deleted

## 2017-07-30 DIAGNOSIS — Z1211 Encounter for screening for malignant neoplasm of colon: Secondary | ICD-10-CM

## 2017-07-30 DIAGNOSIS — Z0000 Encounter for general adult medical examination without abnormal findings: Secondary | ICD-10-CM

## 2017-07-30 LAB — IFOBT (OCCULT BLOOD): IFOBT: NEGATIVE

## 2017-07-31 LAB — LIPID PANEL
Chol/HDL Ratio: 5.3 ratio — ABNORMAL HIGH (ref 0.0–4.4)
Cholesterol, Total: 259 mg/dL — ABNORMAL HIGH (ref 100–199)
HDL: 49 mg/dL (ref >39)
LDL CALC: 185 mg/dL — AB (ref 0–99)
Triglycerides: 124 mg/dL (ref 0–149)
VLDL Cholesterol Cal: 25 mg/dL (ref 5–40)

## 2017-07-31 LAB — VITAMIN D 25 HYDROXY (VIT D DEFICIENCY, FRACTURES): VIT D 25 HYDROXY: 32.3 ng/mL (ref 30.0–100.0)

## 2017-08-05 ENCOUNTER — Encounter: Payer: Self-pay | Admitting: Family Medicine

## 2017-08-05 ENCOUNTER — Ambulatory Visit (INDEPENDENT_AMBULATORY_CARE_PROVIDER_SITE_OTHER): Payer: Medicare HMO | Admitting: Family Medicine

## 2017-08-05 VITALS — BP 112/70 | Ht 64.0 in | Wt 154.8 lb

## 2017-08-05 DIAGNOSIS — M810 Age-related osteoporosis without current pathological fracture: Secondary | ICD-10-CM

## 2017-08-05 DIAGNOSIS — M816 Localized osteoporosis [Lequesne]: Secondary | ICD-10-CM | POA: Diagnosis not present

## 2017-08-05 DIAGNOSIS — E785 Hyperlipidemia, unspecified: Secondary | ICD-10-CM | POA: Diagnosis not present

## 2017-08-05 DIAGNOSIS — Z79899 Other long term (current) drug therapy: Secondary | ICD-10-CM | POA: Diagnosis not present

## 2017-08-05 MED ORDER — PRAVASTATIN SODIUM 10 MG PO TABS: 10.0000 mg | ORAL_TABLET | Freq: Every day | ORAL | 6 refills | Status: DC

## 2017-08-05 NOTE — Progress Notes (Signed)
   Subjective:    Patient ID: Katrina Dean, female    DOB: 05/31/1951, 66 y.o.   MRN: [id number]  Hyperlipidemia  This is a chronic problem. Pertinent negatives include no chest pain. Current antihyperlipidemic treatment includes diet change. Risk factors for coronary artery disease include dyslipidemia and post-menopausal.   Discuss recent blood work results Lab work was reviewed and discussed in detail Patient has stopped taking her Fosamax We went over her bone density and the risk of fractures  Review of Systems  Constitutional: Negative for activity change, appetite change and fatigue.  HENT: Negative for congestion.   Respiratory: Negative for cough.   Cardiovascular: Negative for chest pain.  Gastrointestinal: Negative for abdominal pain.  Endocrine: Negative for polydipsia and polyphagia.  Skin: Negative for color change.  Neurological: Negative for weakness.  Psychiatric/Behavioral: Negative for confusion.       Objective:   Physical Exam  Constitutional: She appears well-developed and well-nourished. No distress.  HENT:  Head: Normocephalic and atraumatic.  Eyes: Right eye exhibits no discharge. Left eye exhibits no discharge.  Neck: No tracheal deviation present.  Cardiovascular: Normal rate, regular rhythm and normal heart sounds.   No murmur heard. Pulmonary/Chest: Effort normal and breath sounds normal. No respiratory distress. She has no wheezes. She has no rales.  Musculoskeletal: She exhibits no edema.  Lymphadenopathy:    She has no cervical adenopathy.  Neurological: She is alert. She exhibits normal muscle tone.  Skin: Skin is warm and dry. No erythema.  Psychiatric: Her behavior is normal.  Vitals reviewed.         Assessment & Plan:  Osteoporosis does not tolerate Fosamax I recommend IV  Reclast this was discussed with the patient she agrees to this  Hyperlipidemia start low-dose statin patient hesitant but would like to try it she  understands she is at higher risk of heart disease the goal is to get her LDL below 100 or even below 70 if possible we will start off with 10 mg pravastatin she will start off taking it 3 days a week and gradually titrate up as she adjusts to it and tolerates that if she has trouble she will let us know  Follow-up 6 months

## 2017-08-05 NOTE — Patient Instructions (Signed)

## 2017-09-07 ENCOUNTER — Encounter (HOSPITAL_COMMUNITY): Payer: BLUE CROSS/BLUE SHIELD

## 2017-10-12 DIAGNOSIS — H35362 Drusen (degenerative) of macula, left eye: Secondary | ICD-10-CM | POA: Diagnosis not present

## 2017-10-12 DIAGNOSIS — Z0100 Encounter for examination of eyes and vision without abnormal findings: Secondary | ICD-10-CM | POA: Diagnosis not present

## 2017-10-12 DIAGNOSIS — H5210 Myopia, unspecified eye: Secondary | ICD-10-CM | POA: Diagnosis not present

## 2017-11-09 ENCOUNTER — Ambulatory Visit: Payer: Medicare HMO | Admitting: Family Medicine

## 2018-02-07 DIAGNOSIS — R69 Illness, unspecified: Secondary | ICD-10-CM | POA: Diagnosis not present

## 2018-03-23 ENCOUNTER — Telehealth: Payer: Self-pay | Admitting: Family Medicine

## 2018-03-23 MED ORDER — ALBUTEROL SULFATE HFA 108 (90 BASE) MCG/ACT IN AERS: 2.0000 | INHALATION_SPRAY | Freq: Four times a day (QID) | RESPIRATORY_TRACT | 6 refills | Status: DC | PRN

## 2018-03-23 NOTE — Telephone Encounter (Signed)
Patient is requesting refill on ventolin HFA inhaler. [facility]

## 2018-03-23 NOTE — Telephone Encounter (Signed)
May refill x6 

## 2018-03-23 NOTE — Telephone Encounter (Signed)
Rx sent and pt is aware.  

## 2018-04-29 ENCOUNTER — Ambulatory Visit (INDEPENDENT_AMBULATORY_CARE_PROVIDER_SITE_OTHER): Payer: Medicare HMO | Admitting: Family Medicine

## 2018-04-29 ENCOUNTER — Encounter: Payer: Self-pay | Admitting: Family Medicine

## 2018-04-29 VITALS — BP 130/98 | Temp 99.0°F | Ht 64.0 in | Wt 161.1 lb

## 2018-04-29 DIAGNOSIS — M810 Age-related osteoporosis without current pathological fracture: Secondary | ICD-10-CM

## 2018-04-29 DIAGNOSIS — Z1231 Encounter for screening mammogram for malignant neoplasm of breast: Secondary | ICD-10-CM | POA: Diagnosis not present

## 2018-04-29 DIAGNOSIS — J4521 Mild intermittent asthma with (acute) exacerbation: Secondary | ICD-10-CM | POA: Diagnosis not present

## 2018-04-29 DIAGNOSIS — J3089 Other allergic rhinitis: Secondary | ICD-10-CM | POA: Diagnosis not present

## 2018-04-29 MED ORDER — METHYLPREDNISOLONE ACETATE 40 MG/ML IJ SUSP
40.0000 mg | Freq: Once | INTRAMUSCULAR | Status: AC
  Administered 2018-04-29: 40 mg via INTRAMUSCULAR

## 2018-04-29 MED ORDER — METHYLPREDNISOLONE ACETATE 40 MG/ML IJ SUSP: 40.0000 mg | Freq: Once | INTRAMUSCULAR | Status: DC

## 2018-04-29 NOTE — Patient Instructions (Addendum)
Fexofenadine 180 mg one daily 30 for 5 dollars at [facility]  Fluticasone Daily  We will set up appointment with allergist

## 2018-04-29 NOTE — Progress Notes (Signed)
   Subjective:    Patient ID: Katrina Dean, female    DOB: 1951-08-18, 67 y.o.   MRN: [id number]  HPI Patient is here today with complaints of a headache,cough, congestion,wheezing,runnynose,right ear pain,fever since Tuesday.She has been taking Robitussin and Goody's powder. Patient relates chest congestion coughing intermittent wheezing relates allergy symptoms worse when she is around certain things she wonders if she is allergic to her dogs or environmental it was not as bad when she was in [place] but it has worsened since she is come back home denies any high fevers does relate history of this type of stuff  Review of Systems  Constitutional: Negative for activity change and fever.  HENT: Positive for congestion and rhinorrhea. Negative for ear pain.   Eyes: Negative for discharge.  Respiratory: Positive for cough. Negative for shortness of breath and wheezing.   Cardiovascular: Negative for chest pain.       Objective:   Physical Exam  Constitutional: She appears well-developed.  HENT:  Head: Normocephalic.  Nose: Nose normal.  Mouth/Throat: Oropharynx is clear and moist. No oropharyngeal exudate.  Neck: Neck supple.  Cardiovascular: Normal rate and normal heart sounds.  No murmur heard. Pulmonary/Chest: Effort normal and breath sounds normal. She has no wheezes.  Lymphadenopathy:    She has no cervical adenopathy.  Skin: Skin is warm and dry.  Nursing note and vitals reviewed.   15 minutes was spent with patient today discussing healthcare issues which they came.  More than 50% of this visit-total duration of visit-was spent in counseling and coordination of care.  Please see diagnosis regarding the focus of this coordination and care       Assessment & Plan:  Reactive airway Possible allergies OTC measures discussed Depo-Medrol Albuterol as needed Referral for allergist testing  Due for bone density and mammogram

## 2018-05-02 ENCOUNTER — Encounter: Payer: Self-pay | Admitting: Family Medicine

## 2018-05-02 ENCOUNTER — Encounter (INDEPENDENT_AMBULATORY_CARE_PROVIDER_SITE_OTHER): Payer: Self-pay

## 2018-06-07 ENCOUNTER — Ambulatory Visit: Payer: Medicare HMO | Admitting: Allergy & Immunology

## 2018-06-07 ENCOUNTER — Encounter: Payer: Self-pay | Admitting: Allergy & Immunology

## 2018-06-07 VITALS — BP 130/80 | HR 84 | Temp 98.3°F | Resp 18 | Ht 63.0 in | Wt 163.0 lb

## 2018-06-07 DIAGNOSIS — J3089 Other allergic rhinitis: Secondary | ICD-10-CM | POA: Diagnosis not present

## 2018-06-07 DIAGNOSIS — J4540 Moderate persistent asthma, uncomplicated: Secondary | ICD-10-CM

## 2018-06-07 DIAGNOSIS — J4520 Mild intermittent asthma, uncomplicated: Secondary | ICD-10-CM | POA: Diagnosis not present

## 2018-06-07 DIAGNOSIS — J302 Other seasonal allergic rhinitis: Secondary | ICD-10-CM | POA: Diagnosis not present

## 2018-06-07 DIAGNOSIS — J310 Chronic rhinitis: Secondary | ICD-10-CM | POA: Insufficient documentation

## 2018-06-07 HISTORY — DX: Moderate persistent asthma, uncomplicated: J45.40

## 2018-06-07 MED ORDER — CETIRIZINE HCL 10 MG PO TABS
10.0000 mg | ORAL_TABLET | Freq: Every day | ORAL | 5 refills | Status: DC
Start: 2018-06-07 — End: 2018-06-15

## 2018-06-07 MED ORDER — AZELASTINE HCL 0.15 % NA SOLN: NASAL | 5 refills | Status: DC

## 2018-06-07 NOTE — Progress Notes (Signed)
NEW PATIENT  Date of Service/Encounter:  06/07/18  Referring provider: Kathyrn Drown, MD   Assessment:   Moderate persistent asthma, uncomplicated  Perennial and seasonal allergic rhinitis (weeds, grasses, indoor molds, outdoor molds, dust mites and dog)    Ms. Mcqueen presents with a history of wheezing and allergic rhinitis symptoms. While she does not have reversibility that is significant per ATS criteria, there is some improvement with the nebulizer treatment. It is not clear how much help the nebulizer provided, but I think it is worth the addition of a combined ICS/LABA to see if there is any improvement in her symptoms. It is possible that her lung function has deteriorated so slowly such that she does not appreciate how much function she has lost. It is also possible that she has developed emphysema secondary to her prolonged smoking history. Testing today demonstrated multiple positives on intradermal testing, and we will increase her medication regimen to try to get on top of her symptoms. We could consider allergen immunotherapy in the future as well.   Plan/Recommendations:   1. Wheezing - COPD versus asthma - Lung testing demonstrated a forced expiratory volume (FEV1) of 59%, but it did improve slightly with albuterol use. - Given your low values and your consistent wheezing as well as your fairly frequent steroid injections, I think a trial of a daily inhaled steroid combined with a long acting albuterol could be beneficial.  -Spacer sample and demonstration provided.  - Daily controller medication(s): Symbicort 160/4.30mcg two puffs twice daily with spacer - Prior to physical activity: Ventolin 2 puffs 10-15 minutes before physical activity. - Rescue medications: Ventolin 4 puffs every 4-6 hours as needed - Asthma control goals:  * Full participation in all desired activities (may need albuterol before activity) * Albuterol use two time or less a week on average (not  counting use with activity) * Cough interfering with sleep two time or less a month * Oral steroids no more than once a year * No hospitalizations  2. Chronic rhinitis - Testing today showed: weeds, grasses, indoor molds, outdoor molds, dust mites and dog - Avoidance measures provided. - Continue with: Flonase (fluticasone) two sprays per nostril daily - Start taking: Zyrtec (cetirizine) 10mg  tablet once daily and Astelin (azelastine) 2 sprays per nostril 1-2 times daily as needed - You can use an extra dose of the antihistamine, if needed, for breakthrough symptoms.  - Consider nasal saline rinses 1-2 times daily to remove allergens from the nasal cavities as well as help with mucous clearance (this is especially helpful to do before the nasal sprays are given) - Consider allergy shots as a means of long-term control. - Allergy shots "re-train" and "reset" the immune system to ignore environmental allergens and decrease the resulting immune response to those allergens (sneezing, itchy watery eyes, runny nose, nasal congestion, etc).    - Allergy shots improve symptoms in 75-85% of patients.  - We can discuss more at the next appointment if the medications are not working for you.  3. Return in about 4 weeks (around 07/05/2018).  Subjective:   Shaunte A Windhorst is a 67 y.o. female presenting today for evaluation of  Chief Complaint  Patient presents with  . Nasal Congestion  . Cough    Productive at times  . Wheezing    Dyneisha A Laski has a history of the following: Patient Active Problem List   Diagnosis Date Noted  . Moderate persistent asthma, uncomplicated 46/27/0350  . Chronic rhinitis  06/07/2018  . Osteoporosis 05/24/2016  . Allergic rhinitis 05/13/2016  . Lactose intolerance 12/26/2015  . GERD (gastroesophageal reflux disease) 12/26/2015  . Hyperlipemia 02/20/2013    History obtained from: chart review and patient.  Shylin A Bodey was referred by Kathyrn Drown, MD.      Chantae is a 67 y.o. female presenting for an evaluation of wheezing and allergy symptoms. Everything seemed to worsen after a tick bite several years back. Symptoms have been going since that time.   Asthma/Respiratory Symptom History: She does have an albuterol inhaler. She does not have trouble breathing but she does endorse some tightness. The albuterol provides some relief with loosening things up a bit. She first got albuterol around four years ago. She does have a history of smoking but quit three years ago. She needed the nebulizer only once or twice. She denies night time awakenings. She gets the Kenalog injections twice annually for breathing.    Allergic Rhinitis Symptom History: She has had symptoms that were in the spring and the fall. Now they are getting worse. She does wear a mask outside and inside. She is using Flonase but is not great about using it on a regular basis. She did take Sudafed in the past but she would get palpitations. She does gets treated for sinus infections at least twice per year. She will have problems when she does not use a mask when cleaning. She does not tolerate prednisone but she does get Kenalog injections twice per year, recently anyway.   She tolerates all of the major food allergens without adverse event. Otherwise, there is no history of other atopic diseases, including drug allergies, stinging insect allergies, or urticaria. There is no significant infectious history. Vaccinations are up to date.    Past Medical History: Patient Active Problem List   Diagnosis Date Noted  . Moderate persistent asthma, uncomplicated 32/95/1884  . Chronic rhinitis 06/07/2018  . Osteoporosis 05/24/2016  . Allergic rhinitis 05/13/2016  . Lactose intolerance 12/26/2015  . GERD (gastroesophageal reflux disease) 12/26/2015  . Hyperlipemia 02/20/2013    Medication List:  Allergies as of 06/07/2018      Reactions   Erythromycin Nausea And Vomiting   Penicillins  Itching, Rash   Has taken cephalosporin   Hydrocodone Nausea Only   Levaquin [levofloxacin In D5w]    Arms/shoulder pain   Prednisone    Mood swings Agitation Can Tolerate Depo Medrol Injections    Sulfur    Sulfa Antibiotics Rash      Medication List        Accurate as of 06/07/18  1:21 PM. Always use your most recent med list.          albuterol 108 (90 Base) MCG/ACT inhaler Commonly known as:  PROVENTIL HFA;VENTOLIN HFA Inhale 2 puffs into the lungs every 6 (six) hours as needed for wheezing or shortness of breath.   Azelastine HCl 0.15 % Soln 2 spays per nostril 1-2 times daily   CALTRATE 600 PO Take by mouth.   cetirizine 10 MG tablet Commonly known as:  ZYRTEC Take 1 tablet (10 mg total) by mouth daily.   clonazePAM 0.5 MG tablet Commonly known as:  KLONOPIN TAKE 1 TABLET TWICE A DAY AS NEEDED   KRILL OIL PO Take by mouth.   multivitamin tablet Take 1 tablet by mouth daily.   pravastatin 10 MG tablet Commonly known as:  PRAVACHOL Take 1 tablet (10 mg total) by mouth daily.   PROBIOTIC DAILY PO Take  by mouth.   Vitamin D (Ergocalciferol) 50000 units Caps capsule Commonly known as:  DRISDOL Take 1 capsule (50,000 Units total) by mouth every 7 (seven) days.   Vitamin D 2000 units Caps One by mouth daily,after finishing the Erogcalciferol 50,000 IU for eight weeks.       Birth History: non-contributory.   Developmental History: non-contributory.   Past Surgical History: Past Surgical History:  Procedure Laterality Date  . ABDOMINAL HYSTERECTOMY    . ADENOIDECTOMY    . LAPAROSCOPIC BILATERAL SALPINGO OOPHERECTOMY    . TONSILLECTOMY    . TONSILLECTOMY AND ADENOIDECTOMY       Family History: Family History  Problem Relation Age of Onset  . Cancer Maternal Grandfather   . Diabetes Paternal Grandfather   . Osteoporosis Mother   . Osteoporosis Maternal Grandmother   . Hypertension Paternal Grandmother   . Cancer Father   . Cancer  Maternal Uncle   . Diabetes Paternal Uncle   . Allergic rhinitis Son   . Angioedema Neg Hx   . Asthma Neg Hx   . Atopy Neg Hx   . Eczema Neg Hx      Social History: Jalina lives at home with three dogs in the home. There is laminate flooring throughout. She has electric heating and central cooling. There are chickens outside as well. There are dust mite covers on the bed, but not the pillows. There is no current tobacco exposure in the home. She smoked one pack per day from 1985 through 2016, although there were some times during that 30 year period where she did not smoke at all.      Review of Systems: a 14-point review of systems is pertinent for what is mentioned in HPI.  Otherwise, all other systems were negative. Constitutional: negative other than that listed in the HPI Eyes: negative other than that listed in the HPI Ears, nose, mouth, throat, and face: negative other than that listed in the HPI Respiratory: negative other than that listed in the HPI Cardiovascular: negative other than that listed in the HPI Gastrointestinal: negative other than that listed in the HPI Genitourinary: negative other than that listed in the HPI Integument: negative other than that listed in the HPI Hematologic: negative other than that listed in the HPI Musculoskeletal: negative other than that listed in the HPI Neurological: negative other than that listed in the HPI Allergy/Immunologic: negative other than that listed in the HPI    Objective:   Blood pressure 130/80, pulse 84, temperature 98.3 F (36.8 C), temperature source Oral, resp. rate 18, height 5\' 3"  (1.6 m), weight 163 lb (73.9 kg), SpO2 97 %. Body mass index is 28.87 kg/m.   Physical Exam:  General: Alert, interactive, in no acute distress. Pleasant female.  Eyes: No conjunctival injection bilaterally, no discharge on the right, no discharge on the left and no Horner-Trantas dots present. PERRL bilaterally. EOMI without  pain. No photophobia.  Ears: Right TM pearly gray with normal light reflex, Left TM pearly gray with normal light reflex, Right TM intact without perforation and Left TM intact without perforation.  Nose/Throat: External nose within normal limits and septum midline. Turbinates edematous and pale with thick discharge. Posterior oropharynx erythematous with cobblestoning in the posterior oropharynx. Tonsils 2+ without exudates.  Tongue without thrush. Neck: Supple without thyromegaly. Trachea midline. Adenopathy: no enlarged lymph nodes appreciated in the anterior cervical, occipital, axillary, epitrochlear, inguinal, or popliteal regions. Lungs: Clear to auscultation without wheezing, rhonchi or rales. No increased work  of breathing. CV: Normal S1/S2. No murmurs. Capillary refill <2 seconds.  Abdomen: Nondistended, nontender. No guarding or rebound tenderness. Bowel sounds present in all fields and hypoactive  Skin: Warm and dry, without lesions or rashes. Extremities:  No clubbing, cyanosis or edema. Neuro:   Grossly intact. No focal deficits appreciated. Responsive to questions.  Diagnostic studies:   Spirometry: results abnormal (FEV1: 1.20/59%, FVC: 1.99/70%, FEV1/FVC: 60%).    Spirometry consistent with mixed obstructive and restrictive disease. Albuterol nebulizer treatment given in clinic with improvement in FEV1 and FVC, but not significant per ATS criteria.  Allergy Studies:   Indoor/Outdoor Percutaneous Adult Environmental Panel: negative to the entire panel with adequate controls.  Indoor/Outdoor Selected Intradermal Environmental Panel: positive to [employer], Grass mix, weed mix, mold mix #2, mold mix #3, mold mix #4, dog and mite mix. Otherwise negative with adequate controls.  Allergy testing results were read and interpreted by myself, documented by clinical staff.       Salvatore Marvel, MD [facility]

## 2018-06-07 NOTE — Patient Instructions (Addendum)
1. Wheezing - COPD versus asthma - Lung testing demonstrated a forced expiratory volume (FEV1) of 59%, but it did improve slightly with albuterol use. - Given your low values and your consistent wheezing as well as your fairly frequent steroid injections, I think a trial of a daily inhaled steroid combined with a long acting albuterol could be beneficial.  -Spacer sample and demonstration provided.  - Daily controller medication(s): Symbicort 160/4.79mcg two puffs twice daily with spacer - Prior to physical activity: Ventolin 2 puffs 10-15 minutes before physical activity. - Rescue medications: Ventolin 4 puffs every 4-6 hours as needed - Asthma control goals:  * Full participation in all desired activities (may need albuterol before activity) * Albuterol use two time or less a week on average (not counting use with activity) * Cough interfering with sleep two time or less a month * Oral steroids no more than once a year * No hospitalizations  2. Chronic rhinitis - Testing today showed: weeds, grasses, indoor molds, outdoor molds, dust mites and dog - Avoidance measures provided. - Continue with: Flonase (fluticasone) two sprays per nostril daily - Start taking: Zyrtec (cetirizine) 10mg  tablet once daily and Astelin (azelastine) 2 sprays per nostril 1-2 times daily as needed - You can use an extra dose of the antihistamine, if needed, for breakthrough symptoms.  - Consider nasal saline rinses 1-2 times daily to remove allergens from the nasal cavities as well as help with mucous clearance (this is especially helpful to do before the nasal sprays are given) - Consider allergy shots as a means of long-term control. - Allergy shots "re-train" and "reset" the immune system to ignore environmental allergens and decrease the resulting immune response to those allergens (sneezing, itchy watery eyes, runny nose, nasal congestion, etc).    - Allergy shots improve symptoms in 75-85% of patients.  - We  can discuss more at the next appointment if the medications are not working for you.  3. Return in about 4 weeks (around 07/05/2018).   Please inform us of any Emergency Department visits, hospitalizations, or changes in symptoms. Call us before going to the ED for breathing or allergy symptoms since we might be able to fit you in for a sick visit. Feel free to contact us anytime with any questions, problems, or concerns.  It was a pleasure to meet you today!  Websites that have reliable patient information: 1. American Academy of Asthma, Allergy, and Immunology: www.aaaai.org 2. Food Allergy Research and Education (FARE): foodallergy.org 3. Mothers of Asthmatics: http://www.asthmacommunitynetwork.org 4. American College of Allergy, Asthma, and Immunology: MonthlyElectricBill.co.uk   Make sure you are registered to vote! If you have moved or changed any of your contact information, you will need to get this updated before voting!    **Please note: [facility] in [place] will be moving to [place] ([place]) as of mid October 2019! Be sure to call the office to confirm the location in the future!   Reducing Pollen Exposure  The American Academy of Allergy, Asthma and Immunology suggests the following steps to reduce your exposure to pollen during allergy seasons.    1. Do not hang sheets or clothing out to dry; pollen may collect on these items. 2. Do not mow lawns or spend time around freshly cut grass; mowing stirs up pollen. 3. Keep windows closed at night.  Keep car windows closed while driving. 4. Minimize morning activities outdoors, a time when pollen counts are usually at  their highest. 5. Stay indoors as much as possible when pollen counts or humidity is high and on windy days when pollen tends to remain in the air longer. 6. Use air conditioning when possible.  Many air conditioners have filters that trap the pollen spores. 7. Use a HEPA room  air filter to remove pollen form the indoor air you breathe.  Control of Mold Allergen   Mold and fungi can grow on a variety of surfaces provided certain temperature and moisture conditions exist.  Outdoor molds grow on plants, decaying vegetation and soil.  The major outdoor mold, Alternaria and Cladosporium, are found in very high numbers during hot and dry conditions.  Generally, a late Summer - Fall peak is seen for common outdoor fungal spores.  Rain will temporarily lower outdoor mold spore count, but counts rise rapidly when the rainy period ends.  The most important indoor molds are Aspergillus and Penicillium.  Dark, humid and poorly ventilated basements are ideal sites for mold growth.  The next most common sites of mold growth are the bathroom and the kitchen.  Outdoor (Seasonal) Mold Control  Positive outdoor molds via skin testing: Bipolaris (Helminthsporium), Drechslera (Curvalaria) and Mucor  1. Use air conditioning and keep windows closed 2. Avoid exposure to decaying vegetation. 3. Avoid leaf raking. 4. Avoid grain handling. 5. Consider wearing a face mask if working in moldy areas.  6.   Indoor (Perennial) Mold Control   Positive indoor molds via skin testing: Aspergillus, Penicillium, Fusarium, Aureobasidium (Pullulara), Rhizopus and Candida  1. Maintain humidity below 50%. 2. Clean washable surfaces with 5% bleach solution. 3. Remove sources e.g. contaminated carpets.     Control of House Dust Mite Allergen    House dust mites play a major role in allergic asthma and rhinitis.  They occur in environments with high humidity wherever human skin, the food for dust mites is found. High levels have been detected in dust obtained from mattresses, pillows, carpets, upholstered furniture, bed covers, clothes and soft toys.  The principal allergen of the house dust mite is found in its feces.  A gram of dust may contain 1,000 mites and 250,000 fecal particles.  Mite  antigen is easily measured in the air during house cleaning activities.    1. Encase mattresses, including the box spring, and pillow, in an air tight cover.  Seal the zipper end of the encased mattresses with wide adhesive tape. 2. Wash the bedding in water of 130 degrees Farenheit weekly.  Avoid cotton comforters/quilts and flannel bedding: the most ideal bed covering is the dacron comforter. 3. Remove all upholstered furniture from the bedroom. 4. Remove carpets, carpet padding, rugs, and non-washable window drapes from the bedroom.  Wash drapes weekly or use plastic window coverings. 5. Remove all non-washable stuffed toys from the bedroom.  Wash stuffed toys weekly. 6. Have the room cleaned frequently with a vacuum cleaner and a damp dust-mop.  The patient should not be in a room which is being cleaned and should wait 1 hour after cleaning before going into the room. 7. Close and seal all heating outlets in the bedroom.  Otherwise, the room will become filled with dust-laden air.  An electric heater can be used to heat the room. 8. Reduce indoor humidity to less than 50%.  Do not use a humidifier.  Control of Dog or Cat Allergen  Avoidance is the best way to manage a dog or cat allergy. If you have a dog or cat and  are allergic to dog or cats, consider removing the dog or cat from the home. If you have a dog or cat but don't want to find it a new home, or if your family wants a pet even though someone in the household is allergic, here are some strategies that may help keep symptoms at bay:  1. Keep the pet out of your bedroom and restrict it to only a few rooms. Be advised that keeping the dog or cat in only one room will not limit the allergens to that room. 2. Don't pet, hug or kiss the dog or cat; if you do, wash your hands with soap and water. 3. High-efficiency particulate air (HEPA) cleaners run continuously in a bedroom or living room can reduce allergen levels over time. 4. Regular  use of a high-efficiency vacuum cleaner or a central vacuum can reduce allergen levels. 5. Giving your dog or cat a bath at least once a week can reduce airborne allergen.  Allergy Shots   Allergies are the result of a chain reaction that starts in the immune system. Your immune system controls how your body defends itself. For instance, if you have an allergy to pollen, your immune system identifies pollen as an invader or allergen. Your immune system overreacts by producing antibodies called Immunoglobulin E (IgE). These antibodies travel to cells that release chemicals, causing an allergic reaction.  The concept behind allergy immunotherapy, whether it is received in the form of shots or tablets, is that the immune system can be desensitized to specific allergens that trigger allergy symptoms. Although it requires time and patience, the payback can be long-term relief.  How Do Allergy Shots Work?  Allergy shots work much like a vaccine. Your body responds to injected amounts of a particular allergen given in increasing doses, eventually developing a resistance and tolerance to it. Allergy shots can lead to decreased, minimal or no allergy symptoms.  There generally are two phases: build-up and maintenance. Build-up often ranges from three to six months and involves receiving injections with increasing amounts of the allergens. The shots are typically given once or twice a week, though more rapid build-up schedules are sometimes used.  The maintenance phase begins when the most effective dose is reached. This dose is different for each person, depending on how allergic you are and your response to the build-up injections. Once the maintenance dose is reached, there are longer periods between injections, typically two to four weeks.  Occasionally doctors give cortisone-type shots that can temporarily reduce allergy symptoms. These types of shots are different and should not be confused with allergy  immunotherapy shots.  Who Can Be Treated with Allergy Shots?  Allergy shots may be a good treatment approach for people with allergic rhinitis (hay fever), allergic asthma, conjunctivitis (eye allergy) or stinging insect allergy.   Before deciding to begin allergy shots, you should consider:  . The length of allergy season and the severity of your symptoms . Whether medications and/or changes to your environment can control your symptoms . Your desire to avoid long-term medication use . Time: allergy immunotherapy requires a major time commitment . Cost: may vary depending on your insurance coverage  Allergy shots for children age 30 and older are effective and often well tolerated. They might prevent the onset of new allergen sensitivities or the progression to asthma.  Allergy shots are not started on patients who are pregnant but can be continued on patients who become pregnant while receiving them. In some  patients with other medical conditions or who take certain common medications, allergy shots may be of risk. It is important to mention other medications you talk to your allergist.   When Will I Feel Better?  Some may experience decreased allergy symptoms during the build-up phase. For others, it may take as long as 12 months on the maintenance dose. If there is no improvement after a year of maintenance, your allergist will discuss other treatment options with you.  If you aren't responding to allergy shots, it may be because there is not enough dose of the allergen in your vaccine or there are missing allergens that were not identified during your allergy testing. Other reasons could be that there are high levels of the allergen in your environment or major exposure to non-allergic triggers like tobacco smoke.  What Is the Length of Treatment?  Once the maintenance dose is reached, allergy shots are generally continued for three to five years. The decision to stop should be  discussed with your allergist at that time. Some people may experience a permanent reduction of allergy symptoms. Others may relapse and a longer course of allergy shots can be considered.  What Are the Possible Reactions?  The two types of adverse reactions that can occur with allergy shots are local and systemic. Common local reactions include very mild redness and swelling at the injection site, which can happen immediately or several hours after. A systemic reaction, which is less common, affects the entire body or a particular body system. They are usually mild and typically respond quickly to medications. Signs include increased allergy symptoms such as sneezing, a stuffy nose or hives.  Rarely, a serious systemic reaction called anaphylaxis can develop. Symptoms include swelling in the throat, wheezing, a feeling of tightness in the chest, nausea or dizziness. Most serious systemic reactions develop within 30 minutes of allergy shots. This is why it is strongly recommended you wait in your doctor's office for 30 minutes after your injections. Your allergist is trained to watch for reactions, and his or her staff is trained and equipped with the proper medications to identify and treat them.  Who Should Administer Allergy Shots?  The preferred location for receiving shots is your prescribing allergist's office. Injections can sometimes be given at another facility where the physician and staff are trained to recognize and treat reactions, and have received instructions by your prescribing allergist.

## 2018-06-10 DIAGNOSIS — Z720 Tobacco use: Secondary | ICD-10-CM | POA: Diagnosis not present

## 2018-06-10 DIAGNOSIS — Z881 Allergy status to other antibiotic agents status: Secondary | ICD-10-CM | POA: Diagnosis not present

## 2018-06-10 DIAGNOSIS — Z888 Allergy status to other drugs, medicaments and biological substances status: Secondary | ICD-10-CM | POA: Diagnosis not present

## 2018-06-10 DIAGNOSIS — R030 Elevated blood-pressure reading, without diagnosis of hypertension: Secondary | ICD-10-CM | POA: Diagnosis not present

## 2018-06-10 DIAGNOSIS — Z880 Allergy status to penicillin: Secondary | ICD-10-CM | POA: Diagnosis not present

## 2018-06-10 DIAGNOSIS — J449 Chronic obstructive pulmonary disease, unspecified: Secondary | ICD-10-CM | POA: Diagnosis not present

## 2018-06-10 DIAGNOSIS — Z882 Allergy status to sulfonamides status: Secondary | ICD-10-CM | POA: Diagnosis not present

## 2018-06-10 DIAGNOSIS — Z833 Family history of diabetes mellitus: Secondary | ICD-10-CM | POA: Diagnosis not present

## 2018-06-12 ENCOUNTER — Encounter (HOSPITAL_COMMUNITY): Payer: Self-pay

## 2018-06-12 ENCOUNTER — Emergency Department (HOSPITAL_COMMUNITY)
Admission: EM | Admit: 2018-06-12 | Discharge: 2018-06-12 | Disposition: A | Discharge status: Home / Self Care | Payer: Medicare HMO | Attending: Emergency Medicine | Admitting: Emergency Medicine

## 2018-06-12 DIAGNOSIS — R030 Elevated blood-pressure reading, without diagnosis of hypertension: Secondary | ICD-10-CM | POA: Insufficient documentation

## 2018-06-12 DIAGNOSIS — Z87891 Personal history of nicotine dependence: Secondary | ICD-10-CM | POA: Diagnosis not present

## 2018-06-12 DIAGNOSIS — I10 Essential (primary) hypertension: Secondary | ICD-10-CM | POA: Diagnosis not present

## 2018-06-12 DIAGNOSIS — Z79899 Other long term (current) drug therapy: Secondary | ICD-10-CM | POA: Diagnosis not present

## 2018-06-12 LAB — COMPREHENSIVE METABOLIC PANEL
ALBUMIN: 4.5 g/dL (ref 3.5–5.0)
ALT: 13 U/L (ref 0–44)
ANION GAP: 9 (ref 5–15)
AST: 16 U/L (ref 15–41)
Alkaline Phosphatase: 58 U/L (ref 38–126)
BUN: 12 mg/dL (ref 8–23)
CO2: 27 mmol/L (ref 22–32)
Calcium: 9.5 mg/dL (ref 8.9–10.3)
Chloride: 105 mmol/L (ref 98–111)
Creatinine, Ser: 0.73 mg/dL (ref 0.44–1.00)
GFR calc non Af Amer: >60 mL/min (ref >60)
GLUCOSE: 101 mg/dL — AB (ref 70–99)
Potassium: 4.1 mmol/L (ref 3.5–5.1)
SODIUM: 141 mmol/L (ref 135–145)
TOTAL PROTEIN: 7.6 g/dL (ref 6.5–8.1)
Total Bilirubin: 0.9 mg/dL (ref 0.3–1.2)

## 2018-06-12 LAB — CBC WITH DIFFERENTIAL/PLATELET
BASOS PCT: 0 %
Basophils Absolute: 0 10*3/uL (ref 0.0–0.1)
EOS ABS: 0.1 10*3/uL (ref 0.0–0.7)
EOS PCT: 2 %
HCT: 42.8 % (ref 36.0–46.0)
Hemoglobin: 14.1 g/dL (ref 12.0–15.0)
Lymphocytes Relative: 22 %
Lymphs Abs: 1.7 10*3/uL (ref 0.7–4.0)
MCH: 31 pg (ref 26.0–34.0)
MCHC: 32.9 g/dL (ref 30.0–36.0)
MCV: 94.1 fL (ref 78.0–100.0)
MONO ABS: 0.4 10*3/uL (ref 0.1–1.0)
MONOS PCT: 5 %
NEUTROS PCT: 71 %
Neutro Abs: 5.4 10*3/uL (ref 1.7–7.7)
PLATELETS: 265 10*3/uL (ref 150–400)
RBC: 4.55 MIL/uL (ref 3.87–5.11)
RDW: 12.9 % (ref 11.5–15.5)
WBC: 7.5 10*3/uL (ref 4.0–10.5)

## 2018-06-12 NOTE — Discharge Instructions (Addendum)
Blood work was normal.  Recommend taking blood pressure log over multiple days and discussing this information with your primary care doctor.

## 2018-06-12 NOTE — ED Triage Notes (Addendum)
Pt reports that she took BP approx one hour ago and it was 170/100. Pt reports that she had episode of dizziness which has resolved. Pt reports that she was in [place] and began feeling dizzy and checked BP . Reports 3 tick bites this week and nasal congestion

## 2018-06-12 NOTE — ED Provider Notes (Signed)
[facility] EMERGENCY DEPARTMENT Provider Note   CSN: [id number] Arrival date & time: 06/12/18  1023     History   Chief Complaint Chief Complaint  Patient presents with  . Hypertension    HPI Katrina Dean is a 67 y.o. female.  Patient is concerned about blood pressure elevation today.  She reports 177/100 today (and additional reading at 165/90).  She feels normal otherwise.  No chest pain, dyspnea, neurological deficits.  She claims to be normally 120/80.  She states no chronic health problems, although problem list states asthma, osteoporosis, GERD, hyperlipidemia.  She smokes 10 cigarettes a day.     Past Medical History:  Diagnosis Date  . Moderate persistent asthma, uncomplicated 10/06/4578  . Osteopenia 2010  . Seasonal allergies     Patient Active Problem List   Diagnosis Date Noted  . Moderate persistent asthma, uncomplicated 99/83/3825  . Chronic rhinitis 06/07/2018  . Osteoporosis 05/24/2016  . Allergic rhinitis 05/13/2016  . Lactose intolerance 12/26/2015  . GERD (gastroesophageal reflux disease) 12/26/2015  . Hyperlipemia 02/20/2013    Past Surgical History:  Procedure Laterality Date  . ABDOMINAL HYSTERECTOMY    . ADENOIDECTOMY    . LAPAROSCOPIC BILATERAL SALPINGO OOPHERECTOMY    . TONSILLECTOMY    . TONSILLECTOMY AND ADENOIDECTOMY       OB History   None      Home Medications    Prior to Admission medications   Medication Sig Start Date End Date Taking? Authorizing Provider  albuterol (PROVENTIL HFA;VENTOLIN HFA) 108 (90 Base) MCG/ACT inhaler Inhale 2 puffs into the lungs every 6 (six) hours as needed for wheezing or shortness of breath. 03/23/18   Kathyrn Drown, MD  Azelastine HCl 0.15 % SOLN 2 spays per nostril 1-2 times daily 06/07/18   Valentina Shaggy, MD  Calcium Carbonate (CALTRATE 600 PO) Take by mouth.    [provider]  cetirizine (ZYRTEC) 10 MG tablet Take 1 tablet (10 mg total) by mouth daily. 06/07/18   Valentina Shaggy, MD  Cholecalciferol (VITAMIN D) 2000 units CAPS One by mouth daily,after finishing the Erogcalciferol 50,000 IU for eight weeks. Patient taking differently: 1,000 Units every other day. One by mouth daily,after finishing the Erogcalciferol 50,000 IU for eight weeks. 04/19/17   Kathyrn Drown, MD  clonazePAM (KLONOPIN) 0.5 MG tablet TAKE 1 TABLET TWICE A DAY AS NEEDED 04/06/17   Nilda Simmer, NP  KRILL OIL PO Take by mouth.    [provider]  Multiple Vitamin (MULTIVITAMIN) tablet Take 1 tablet by mouth daily.    [provider]  pravastatin (PRAVACHOL) 10 MG tablet Take 1 tablet (10 mg total) by mouth daily. Patient not taking: Reported on 06/07/2018 08/05/17   Kathyrn Drown, MD  Probiotic Product (PROBIOTIC DAILY PO) Take by mouth.    [provider]  Vitamin D, Ergocalciferol, (DRISDOL) 50000 units CAPS capsule Take 1 capsule (50,000 Units total) by mouth every 7 (seven) days. Patient not taking: Reported on 06/07/2018 04/19/17   Kathyrn Drown, MD    Family History Family History  Problem Relation Age of Onset  . Cancer Maternal Grandfather   . Diabetes Paternal Grandfather   . Osteoporosis Mother   . Osteoporosis Maternal Grandmother   . Hypertension Paternal Grandmother   . Cancer Father   . Cancer Maternal Uncle   . Diabetes Paternal Uncle   . Allergic rhinitis Son   . Angioedema Neg Hx   . Asthma Neg Hx   .  Atopy Neg Hx   . Eczema Neg Hx     Social History Social History   Tobacco Use  . Smoking status: Former Smoker    Packs/day: 0.50    Years: 5.00    Pack years: 2.50    Types: Cigarettes  . Smokeless tobacco: Never Used  Substance Use Topics  . Alcohol use: No  . Drug use: No     Allergies   Erythromycin; Penicillins; Hydrocodone; Levaquin [levofloxacin in d5w]; Prednisone; Sulfur; and Sulfa antibiotics   Review of Systems Review of Systems  All other systems reviewed and are negative.    Physical  Exam Updated Vital Signs BP 135/68 (BP Location: Left Arm)   Pulse 74   Temp 98.3 F (36.8 C) (Oral)   Resp 14   Ht 5\' 3"  (1.6 m)   Wt 73.9 kg   SpO2 96%   BMI 28.87 kg/m   Physical Exam  Constitutional: She is oriented to person, place, and time. She appears well-developed and well-nourished.  HENT:  Head: Normocephalic and atraumatic.  Eyes: Conjunctivae are normal.  Neck: Neck supple.  Cardiovascular: Normal rate and regular rhythm.  Pulmonary/Chest: Effort normal and breath sounds normal.  Abdominal: Soft. Bowel sounds are normal.  Musculoskeletal: Normal range of motion.  Neurological: She is alert and oriented to person, place, and time.  Skin: Skin is warm and dry.  Psychiatric: She has a normal mood and affect. Her behavior is normal.  Nursing note and vitals reviewed.    ED Treatments / Results  Labs (all labs ordered are listed, but only abnormal results are displayed) Labs Reviewed  COMPREHENSIVE METABOLIC PANEL - Abnormal; Notable for the following components:      Result Value   Glucose, Bld 101 (*)    All other components within normal limits  CBC WITH DIFFERENTIAL/PLATELET    EKG EKG Interpretation  Date/Time:  Sunday June 12 2018 11:18:08 EDT Ventricular Rate:  72 PR Interval:    QRS Duration: 90 QT Interval:  432 QTC Calculation: 473 R Axis:   33 Text Interpretation:  Sinus rhythm Confirmed by Nat Christen [phone]) on 06/12/2018 12:12:59 PM   Radiology No results found.  Procedures Procedures (including critical care time)  Medications Ordered in ED Medications - No data to display   Initial Impression / Assessment and Plan / ED Course  I have reviewed the triage vital signs and the nursing notes.  Pertinent labs & imaging results that were available during my care of the patient were reviewed by me and considered in my medical decision making (see chart for details).     Patient's blood pressure is slightly elevated today.  She  has no somatic complaints.  Basic labs and EKG normal.  Discussed keeping a blood pressure log and follow-up with her primary care doctor  Final Clinical Impressions(s) / ED Diagnoses   Final diagnoses:  Elevated blood pressure reading    ED Discharge Orders    None       Nat Christen, MD 06/12/18 1427

## 2018-06-12 NOTE — ED Notes (Signed)
ED Provider at bedside. 

## 2018-06-14 ENCOUNTER — Ambulatory Visit
Admission: RE | Admit: 2018-06-14 | Discharge: 2018-06-14 | Disposition: A | Discharge status: Home / Self Care | Payer: BLUE CROSS/BLUE SHIELD | Source: Ambulatory Visit | Attending: Family Medicine | Admitting: Family Medicine

## 2018-06-14 ENCOUNTER — Ambulatory Visit
Admission: RE | Admit: 2018-06-14 | Discharge: 2018-06-14 | Disposition: A | Discharge status: Home / Self Care | Payer: Medicare HMO | Source: Ambulatory Visit | Attending: Family Medicine | Admitting: Family Medicine

## 2018-06-14 DIAGNOSIS — M8588 Other specified disorders of bone density and structure, other site: Secondary | ICD-10-CM | POA: Diagnosis not present

## 2018-06-14 DIAGNOSIS — Z780 Asymptomatic menopausal state: Secondary | ICD-10-CM | POA: Diagnosis not present

## 2018-06-14 DIAGNOSIS — M810 Age-related osteoporosis without current pathological fracture: Secondary | ICD-10-CM | POA: Diagnosis not present

## 2018-06-14 DIAGNOSIS — Z1231 Encounter for screening mammogram for malignant neoplasm of breast: Secondary | ICD-10-CM | POA: Diagnosis not present

## 2018-06-15 ENCOUNTER — Encounter: Payer: Self-pay | Admitting: Family Medicine

## 2018-06-15 ENCOUNTER — Ambulatory Visit (INDEPENDENT_AMBULATORY_CARE_PROVIDER_SITE_OTHER): Payer: Medicare HMO | Admitting: Family Medicine

## 2018-06-15 VITALS — BP 126/88 | Ht 63.0 in | Wt 159.2 lb

## 2018-06-15 DIAGNOSIS — E785 Hyperlipidemia, unspecified: Secondary | ICD-10-CM

## 2018-06-15 DIAGNOSIS — M810 Age-related osteoporosis without current pathological fracture: Secondary | ICD-10-CM | POA: Diagnosis not present

## 2018-06-15 DIAGNOSIS — R030 Elevated blood-pressure reading, without diagnosis of hypertension: Secondary | ICD-10-CM

## 2018-06-15 MED ORDER — ALENDRONATE SODIUM 70 MG PO TABS: 70.0000 mg | ORAL_TABLET | ORAL | 11 refills | Status: DC

## 2018-06-15 NOTE — Patient Instructions (Addendum)
Work on eating a healthy diet and continue your walking for exercise.  Avoid taking any anti-inflammatory medications like ibuprofen, advil, goody powders, etc. Because these can raise your blood pressure.  DASH Eating Plan DASH stands for "Dietary Approaches to Stop Hypertension." The DASH eating plan is a healthy eating plan that has been shown to reduce high blood pressure (hypertension). It may also reduce your risk for type 2 diabetes, heart disease, and stroke. The DASH eating plan may also help with weight loss. What are tips for following this plan? General guidelines  Avoid eating more than 2,300 mg (milligrams) of salt (sodium) a day. If you have hypertension, you may need to reduce your sodium intake to 1,500 mg a day.  Limit alcohol intake to no more than 1 drink a day for nonpregnant women and 2 drinks a day for men. One drink equals 12 oz of beer, 5 oz of wine, or 1 oz of hard liquor.  Work with your health care provider to maintain a healthy body weight or to lose weight. Ask what an ideal weight is for you.  Get at least 30 minutes of exercise that causes your heart to beat faster (aerobic exercise) most days of the week. Activities may include walking, swimming, or biking.  Work with your health care provider or diet and nutrition specialist (dietitian) to adjust your eating plan to your individual calorie needs. Reading food labels  Check food labels for the amount of sodium per serving. Choose foods with less than 5 percent of the Daily Value of sodium. Generally, foods with less than 300 mg of sodium per serving fit into this eating plan.  To find whole grains, look for the word "whole" as the first word in the ingredient list. Shopping  Buy products labeled as "low-sodium" or "no salt added."  Buy fresh foods. Avoid canned foods and premade or frozen meals. Cooking  Avoid adding salt when cooking. Use salt-free seasonings or herbs instead of table salt or sea salt.  Check with your health care provider or pharmacist before using salt substitutes.  Do not fry foods. Cook foods using healthy methods such as baking, boiling, grilling, and broiling instead.  Cook with heart-healthy oils, such as olive, canola, soybean, or sunflower oil. Meal planning   Eat a balanced diet that includes: ? 5 or more servings of fruits and vegetables each day. At each meal, try to fill half of your plate with fruits and vegetables. ? Up to 6-8 servings of whole grains each day. ? Less than 6 oz of lean meat, poultry, or fish each day. A 3-oz serving of meat is about the same size as a deck of cards. One egg equals 1 oz. ? 2 servings of low-fat dairy each day. ? A serving of nuts, seeds, or beans 5 times each week. ? Heart-healthy fats. Healthy fats called Omega-3 fatty acids are found in foods such as flaxseeds and coldwater fish, like sardines, salmon, and mackerel.  Limit how much you eat of the following: ? Canned or prepackaged foods. ? Food that is high in trans fat, such as fried foods. ? Food that is high in saturated fat, such as fatty meat. ? Sweets, desserts, sugary drinks, and other foods with added sugar. ? Full-fat dairy products.  Do not salt foods before eating.  Try to eat at least 2 vegetarian meals each week.  Eat more home-cooked food and less restaurant, buffet, and fast food.  When eating at a restaurant, ask  that your food be prepared with less salt or no salt, if possible. What foods are recommended? The items listed may not be a complete list. Talk with your dietitian about what dietary choices are best for you. Grains Whole-grain or whole-wheat bread. Whole-grain or whole-wheat pasta. Brown rice. [place]. Bulgur. Whole-grain and low-sodium cereals. Pita bread. Low-fat, low-sodium crackers. Whole-wheat flour tortillas. Vegetables Fresh or frozen vegetables (raw, steamed, roasted, or grilled). Low-sodium or reduced-sodium tomato and  vegetable juice. Low-sodium or reduced-sodium tomato sauce and tomato paste. Low-sodium or reduced-sodium canned vegetables. Fruits All fresh, dried, or frozen fruit. Canned fruit in natural juice (without added sugar). Meat and other protein foods Skinless chicken or [place]. Ground chicken or [place]. Pork with fat trimmed off. Fish and seafood. Egg whites. Dried beans, peas, or lentils. Unsalted nuts, nut butters, and seeds. Unsalted canned beans. Lean cuts of beef with fat trimmed off. Low-sodium, lean deli meat. Dairy Low-fat (1%) or fat-free (skim) milk. Fat-free, low-fat, or reduced-fat cheeses. Nonfat, low-sodium ricotta or cottage cheese. Low-fat or nonfat yogurt. Low-fat, low-sodium cheese. Fats and oils Soft margarine without trans fats. Vegetable oil. Low-fat, reduced-fat, or light mayonnaise and salad dressings (reduced-sodium). Canola, safflower, olive, soybean, and sunflower oils. Avocado. Seasoning and other foods Herbs. Spices. Seasoning mixes without salt. Unsalted popcorn and pretzels. Fat-free sweets. What foods are not recommended? The items listed may not be a complete list. Talk with your dietitian about what dietary choices are best for you. Grains Baked goods made with fat, such as croissants, muffins, or some breads. Dry pasta or rice meal packs. Vegetables Creamed or fried vegetables. Vegetables in a cheese sauce. Regular canned vegetables (not low-sodium or reduced-sodium). Regular canned tomato sauce and paste (not low-sodium or reduced-sodium). Regular tomato and vegetable juice (not low-sodium or reduced-sodium). Angie Fava. Olives. Fruits Canned fruit in a light or heavy syrup. Fried fruit. Fruit in cream or butter sauce. Meat and other protein foods Fatty cuts of meat. Ribs. Fried meat. Berniece Salines. Sausage. Bologna and other processed lunch meats. Salami. Fatback. Hotdogs. Bratwurst. Salted nuts and seeds. Canned beans with added salt. Canned or smoked fish. Whole eggs or  egg yolks. Chicken or [place] with skin. Dairy Whole or 2% milk, cream, and half-and-half. Whole or full-fat cream cheese. Whole-fat or sweetened yogurt. Full-fat cheese. Nondairy creamers. Whipped toppings. Processed cheese and cheese spreads. Fats and oils Butter. Stick margarine. Lard. Shortening. Ghee. Bacon fat. Tropical oils, such as coconut, palm kernel, or palm oil. Seasoning and other foods Salted popcorn and pretzels. Onion salt, garlic salt, seasoned salt, table salt, and sea salt. Worcestershire sauce. Tartar sauce. Barbecue sauce. Teriyaki sauce. Soy sauce, including reduced-sodium. Steak sauce. Canned and packaged gravies. Fish sauce. Oyster sauce. Cocktail sauce. Horseradish that you find on the shelf. Ketchup. Mustard. Meat flavorings and tenderizers. Bouillon cubes. Hot sauce and Tabasco sauce. Premade or packaged marinades. Premade or packaged taco seasonings. Relishes. Regular salad dressings. Where to find more information:  National Heart, Lung, and [place]: https://wilson-eaton.com/  American Heart Association: www.heart.org Summary  The DASH eating plan is a healthy eating plan that has been shown to reduce high blood pressure (hypertension). It may also reduce your risk for type 2 diabetes, heart disease, and stroke.  With the DASH eating plan, you should limit salt (sodium) intake to 2,300 mg a day. If you have hypertension, you may need to reduce your sodium intake to 1,500 mg a day.  When on the DASH eating plan, aim to eat more fresh fruits  and vegetables, whole grains, lean proteins, low-fat dairy, and heart-healthy fats.  Work with your health care provider or diet and nutrition specialist (dietitian) to adjust your eating plan to your individual calorie needs. This information is not intended to replace advice given to you by your health care provider. Make sure you discuss any questions you have with your health care provider. Document Released: 09/10/2011  Document Revised: 09/14/2016 Document Reviewed: 09/14/2016 Elsevier Interactive Patient Education  [employer].

## 2018-06-15 NOTE — Progress Notes (Signed)
Subjective:    Patient ID: Katrina Dean, female    DOB: 1950/10/29, 67 y.o.   MRN: [id number]  HPI Pt here today for hospital follow up. Pt went to ED on 06/12/18. Pt states she checked her BP at [facility] and is was 177/100. Pt states she has not had an issue with her BP before. She was not feeling badly at the time.  All labs and EKG at ED were normal.  Pt has been under stress more here lately due to husband having some issues.   06/13/18 woke up with a headache, left eye and back of neck. Described as pounding, 6/10 pain.  Took a goody powder which completely relieved pain. No other headaches.   Denies dizziness or light-headedness, no chest pain or shortness of breath. No extremity swelling. No changes vision. Sees eye doctor regularly last visit in January.  Started walking for exercise last month. 3 miles once a day.  Diet: since hospital has been on a "water fast." still eats a small snack a day.  Pt brought in home BP readings from wrist cuff over the last few days.  All elevated over 140/90.  Review of Systems  Eyes: Negative for visual disturbance.  Respiratory: Negative for shortness of breath.   Cardiovascular: Negative for chest pain and leg swelling.  Neurological: Positive for headaches. Negative for dizziness and light-headedness.  All other systems reviewed and are negative.      Objective:   Physical Exam  Constitutional: She is oriented to person, place, and time. She appears well-developed and well-nourished. No distress.  HENT:  Head: Normocephalic and atraumatic.  Eyes: Pupils are equal, round, and reactive to light. Right eye exhibits no discharge. Left eye exhibits no discharge.  Neck: Neck supple.  Cardiovascular: Normal rate, regular rhythm, normal heart sounds and intact distal pulses.  No murmur heard. Pulmonary/Chest: Effort normal and breath sounds normal. No respiratory distress. She has no wheezes.  Musculoskeletal: She exhibits no edema.    Lymphadenopathy:    She has no cervical adenopathy.  Neurological: She is alert and oriented to person, place, and time.  Skin: Skin is warm and dry. No rash noted.  Psychiatric: She has a normal mood and affect.  Vitals reviewed.      Assessment & Plan:  1. Blood pressure elevated without history of HTN  BP mildly elevated in office today.  Discussed benefits of healthy diet such as DASH diet, and info provided to patient. Encouraged her to continue with her walking.  She will continue to check BP at home, however she is using a wrist cuff which we discussed is not as accurate.  She will bring this with her to her next visit. F/u in 8-10 weeks to see how lifestyle changes are going.  If BP is still elevated at that time may consider starting on medication therapy.  2. Hyperlipidemia, unspecified hyperlipidemia type  Lipid panel ordered to check prior to next appt.  Pt is not taking her pravastatin. Encouraged healthy diet and exercise.  - Lipid panel  3. Age-related osteoporosis without current pathological fracture Discussed bone density results with patient indicating osteoporosis and increased risk of hip fracture. Benefits and risks of fosamax discussed and will start taking, rx sent to preferred pharmacy. She will continue with her walking. Encouraged to take calcium and vitamin D supplement.  Vitamin D level will be checked prior to next visit.  - alendronate (FOSAMAX) 70 MG tablet; Take 1 tablet (70 mg total) by  mouth every 7 (seven) days. Take with a full glass of water on an empty stomach.  Dispense: 4 tablet; Refill: 11 - Vitamin D (25 hydroxy)  As attending physician to this patient visit, this patient was seen in conjunction with the nurse practitioner.  The history,physical and treatment plan was reviewed with the nurse practitioner and pertinent findings were verified with the patient.  Also the treatment plan was reviewed with the patient while they were present. It is  not imperative at this point in time to start medication blood pressure is borderline she is exercising watching diet trying to lose weight hopefully this will help with not enough we will start meds  I did discuss the osteoporosis aspect with her

## 2018-07-12 ENCOUNTER — Ambulatory Visit: Payer: Medicare HMO | Admitting: Allergy & Immunology

## 2018-07-18 DIAGNOSIS — E785 Hyperlipidemia, unspecified: Secondary | ICD-10-CM | POA: Diagnosis not present

## 2018-07-18 DIAGNOSIS — M810 Age-related osteoporosis without current pathological fracture: Secondary | ICD-10-CM | POA: Diagnosis not present

## 2018-07-19 LAB — VITAMIN D 25 HYDROXY (VIT D DEFICIENCY, FRACTURES): VIT D 25 HYDROXY: 23 ng/mL — AB (ref 30.0–100.0)

## 2018-07-19 LAB — LIPID PANEL
CHOLESTEROL TOTAL: 248 mg/dL — AB (ref 100–199)
Chol/HDL Ratio: 5 ratio — ABNORMAL HIGH (ref 0.0–4.4)
HDL: 50 mg/dL (ref >39)
LDL Calculated: 173 mg/dL — ABNORMAL HIGH (ref 0–99)
TRIGLYCERIDES: 126 mg/dL (ref 0–149)
VLDL Cholesterol Cal: 25 mg/dL (ref 5–40)

## 2018-08-05 ENCOUNTER — Ambulatory Visit (INDEPENDENT_AMBULATORY_CARE_PROVIDER_SITE_OTHER): Payer: Medicare HMO | Admitting: Family Medicine

## 2018-08-05 ENCOUNTER — Telehealth: Payer: Self-pay | Admitting: Family Medicine

## 2018-08-05 ENCOUNTER — Encounter: Payer: Self-pay | Admitting: Family Medicine

## 2018-08-05 VITALS — BP 114/76 | Ht 63.0 in | Wt 157.0 lb

## 2018-08-05 DIAGNOSIS — E785 Hyperlipidemia, unspecified: Secondary | ICD-10-CM

## 2018-08-05 NOTE — Telephone Encounter (Signed)
Pt has scheduled nurse visit for 08/22/18 for pneumonia shot. Does she need Pneum 13 or Pneum 23?  Per Epic, only immunization is Tdap. Please advise. Thank you.

## 2018-08-05 NOTE — Telephone Encounter (Signed)
Pt has appt on 11/18 for pneumonia shot. Pt does not have any previous pneumonia shots documented. Would you like her to get the Pneum 13 or 23? Please advise. Thank you

## 2018-08-05 NOTE — Progress Notes (Signed)
Made notes on note section of nurse visit.

## 2018-08-05 NOTE — Telephone Encounter (Signed)
Prevnar 13 also patient states she was going to do stool test for blood at that time IFBOT

## 2018-08-05 NOTE — Progress Notes (Signed)
   Subjective:    Patient ID: Katrina Dean, female    DOB: 1951/07/04, 67 y.o.   MRN: [id number]  Hypertension  This is a new problem. The current episode started more than 1 month ago. Pertinent negatives include no chest pain or shortness of breath. There are no compliance problems.    Pt states that she has made dramatic changes. She states she eats bananas or figs for potassium. Pt has also stopped caffeine and goody powder.   Patient is trying to stay active trying to eat healthy  Review of Systems  Constitutional: Negative for activity change, appetite change and fatigue.  HENT: Negative for congestion and rhinorrhea.   Respiratory: Negative for cough and shortness of breath.   Cardiovascular: Negative for chest pain and leg swelling.  Gastrointestinal: Negative for abdominal pain and diarrhea.  Endocrine: Negative for polydipsia and polyphagia.  Skin: Negative for color change.  Neurological: Negative for dizziness and weakness.  Psychiatric/Behavioral: Negative for behavioral problems and confusion.       Objective:   Physical Exam  Constitutional: She appears well-nourished. No distress.  HENT:  Head: Normocephalic and atraumatic.  Eyes: Right eye exhibits no discharge. Left eye exhibits no discharge.  Neck: No tracheal deviation present.  Cardiovascular: Normal rate, regular rhythm and normal heart sounds.  No murmur heard. Pulmonary/Chest: Effort normal and breath sounds normal. No respiratory distress.  Musculoskeletal: She exhibits no edema.  Lymphadenopathy:    She has no cervical adenopathy.  Neurological: She is alert. Coordination normal.  Skin: Skin is warm and dry.  Psychiatric: She has a normal mood and affect. Her behavior is normal.  Vitals reviewed.   Patient advised to do colonoscopy she defers currently she will think about it      Assessment & Plan:  Osteoporosis-continue medication tolerating it well so far  Low vitamin D patient now  taking 800 units daily hopefully this will help  Hyperlipidemia patient states that she is eating much better now we will recheck lab work again by spring time, if it is worsening over time will need to be on medication  Patient does agreed to do stool testing for blood as well as Flu vaccine and Prevnar 13 but she would like to do all of this as a nurse visit next week

## 2018-08-22 ENCOUNTER — Ambulatory Visit: Payer: Medicare HMO

## 2018-11-10 DIAGNOSIS — L570 Actinic keratosis: Secondary | ICD-10-CM | POA: Diagnosis not present

## 2018-11-10 DIAGNOSIS — D485 Neoplasm of uncertain behavior of skin: Secondary | ICD-10-CM | POA: Diagnosis not present

## 2018-12-05 DIAGNOSIS — D225 Melanocytic nevi of trunk: Secondary | ICD-10-CM | POA: Diagnosis not present

## 2018-12-05 DIAGNOSIS — D1801 Hemangioma of skin and subcutaneous tissue: Secondary | ICD-10-CM | POA: Diagnosis not present

## 2018-12-05 DIAGNOSIS — L814 Other melanin hyperpigmentation: Secondary | ICD-10-CM | POA: Diagnosis not present

## 2018-12-05 DIAGNOSIS — D485 Neoplasm of uncertain behavior of skin: Secondary | ICD-10-CM | POA: Diagnosis not present

## 2018-12-05 DIAGNOSIS — D2261 Melanocytic nevi of right upper limb, including shoulder: Secondary | ICD-10-CM | POA: Diagnosis not present

## 2018-12-05 DIAGNOSIS — L821 Other seborrheic keratosis: Secondary | ICD-10-CM | POA: Diagnosis not present

## 2018-12-05 DIAGNOSIS — L570 Actinic keratosis: Secondary | ICD-10-CM | POA: Diagnosis not present

## 2018-12-12 ENCOUNTER — Ambulatory Visit (INDEPENDENT_AMBULATORY_CARE_PROVIDER_SITE_OTHER): Payer: Medicare HMO | Admitting: Family Medicine

## 2018-12-12 ENCOUNTER — Encounter: Payer: Self-pay | Admitting: Family Medicine

## 2018-12-12 VITALS — BP 112/90 | Temp 99.1°F | Ht 63.0 in | Wt 156.0 lb

## 2018-12-12 DIAGNOSIS — R062 Wheezing: Secondary | ICD-10-CM | POA: Diagnosis not present

## 2018-12-12 DIAGNOSIS — R0600 Dyspnea, unspecified: Secondary | ICD-10-CM | POA: Diagnosis not present

## 2018-12-12 DIAGNOSIS — J019 Acute sinusitis, unspecified: Secondary | ICD-10-CM | POA: Diagnosis not present

## 2018-12-12 MED ORDER — DOXYCYCLINE HYCLATE 100 MG PO TABS: 100.0000 mg | ORAL_TABLET | Freq: Two times a day (BID) | ORAL | 0 refills | Status: AC

## 2018-12-12 MED ORDER — METHYLPREDNISOLONE ACETATE 40 MG/ML IJ SUSP
40.0000 mg | Freq: Once | INTRAMUSCULAR | Status: AC
  Administered 2018-12-12: 40 mg via INTRAMUSCULAR

## 2018-12-12 MED ORDER — CLONAZEPAM 0.5 MG PO TABS: 0.5000 mg | ORAL_TABLET | Freq: Every evening | ORAL | 0 refills | Status: DC | PRN

## 2018-12-12 MED ORDER — ALBUTEROL SULFATE (2.5 MG/3ML) 0.083% IN NEBU
2.5000 mg | INHALATION_SOLUTION | Freq: Once | RESPIRATORY_TRACT | Status: AC
  Administered 2018-12-12: 2.5 mg via RESPIRATORY_TRACT

## 2018-12-12 NOTE — Progress Notes (Signed)
Subjective:    Patient ID: Katrina Dean, female    DOB: September 22, 1951, 68 y.o.   MRN: [id number]  HPI   Patient is here today due to a cough,wheezing,runny nose, sore throat.  Started with some coughing and some congestion the day before she left for [place]. 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   Reports cough is productive of clear, sticky sputum. States worse in morning. Using albuterol inhaler, 1 puff about once per day and is helpful. Later started with sore throat. No body aches or fevers. Some nausea - states feeling PND. No vomiting or diarrhea.   She has been taking Robitussin Dm, tylenol, inhaler. Helpful.    Review of Systems  Constitutional: Negative for chills and fever.  HENT: Positive for congestion, postnasal drip, rhinorrhea, sinus pressure and sore throat. Negative for ear pain.   Eyes: Negative for discharge.  Respiratory: Positive for cough and wheezing. Negative for shortness of breath.   Gastrointestinal: Negative for diarrhea, nausea and vomiting.  Musculoskeletal: Negative for myalgias.  Neurological: Negative for headaches.       Objective:   Physical Exam Vitals signs and nursing note reviewed.  Constitutional:      General: She is not in acute distress.    Appearance: Normal appearance. She is not toxic-appearing.  HENT:     Head: Normocephalic and atraumatic.     Right Ear: Tympanic membrane normal.     Left Ear: Tympanic membrane normal.     Nose: Congestion present.     Right Sinus: No maxillary sinus tenderness or frontal sinus tenderness.     Left Sinus: No maxillary sinus tenderness or frontal sinus tenderness.     Mouth/Throat:     Mouth: Mucous membranes are moist.     Pharynx: Oropharynx is clear.  Eyes:     General:        Right  eye: No discharge.        Left eye: No discharge.  Neck:     Musculoskeletal: Neck supple. No neck rigidity.  Cardiovascular:     Rate and Rhythm: Normal rate and regular rhythm.     Heart sounds: Normal heart sounds.  Pulmonary:     Effort: Pulmonary effort is normal. No respiratory distress.     Breath sounds: Wheezing present. No rales.  Lymphadenopathy:     Cervical: No cervical adenopathy.  Skin:    General: Skin is warm and dry.  Neurological:     Mental Status: She is alert and oriented to person, place, and time.  Psychiatric:        Behavior: Behavior normal.    O2 sat 91% on RA prior to breathing treatment. After breathing treatment 92-93% on RA, improved wheezing on auscultation.       Assessment & Plan:  Acute rhinosinusitis  Wheezing - Plan: methylPREDNISolone acetate (DEPO-MEDROL) injection 40 mg, albuterol (PROVENTIL) (2.5 MG/3ML) 0.083% nebulizer solution 2.5 mg  Discussed with patient likely post-viral etiology, will treat with doxycycline. Pt with wheezing on exam today and O2 sat in low 90s, depo-medrol IM given and breathing treatment given. Some improvement after breathing treatment. Pt states she felt like she was breathing easier. Instructed her to take her albuterol inhaler q4h for her wheezing until she is feeling better then can go back to  prn use. Symptomatic care discussed. She should f/u if her symptoms worsen or fail to improve. Discussed if severe shortness of breath to go to ED.   Dr. Sallee Lange was consulted on this case and is in agreement with the above treatment plan.

## 2019-01-11 IMAGING — MG 2D DIGITAL SCREENING BILATERAL MAMMOGRAM WITH CAD AND ADJUNCT TO
8 of 12 series · 8 of 28 positions shown · non-contrast
Comparison: Previous exam(s).

CLINICAL DATA: Screening.

EXAM:
2D DIGITAL SCREENING BILATERAL MAMMOGRAM WITH CAD AND ADJUNCT TOMO

[R CC]
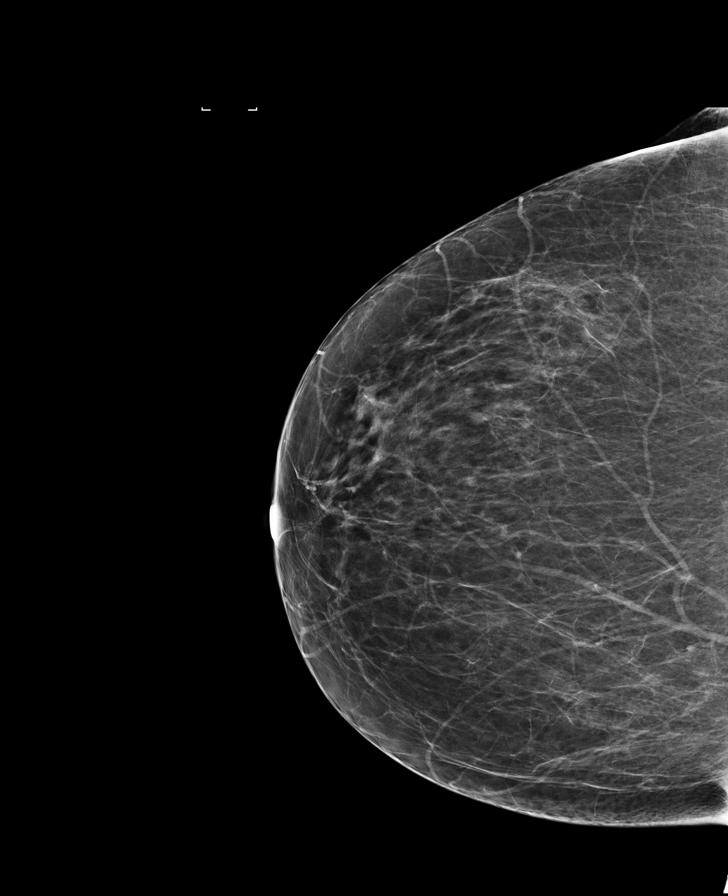

[L CC synth-2D]
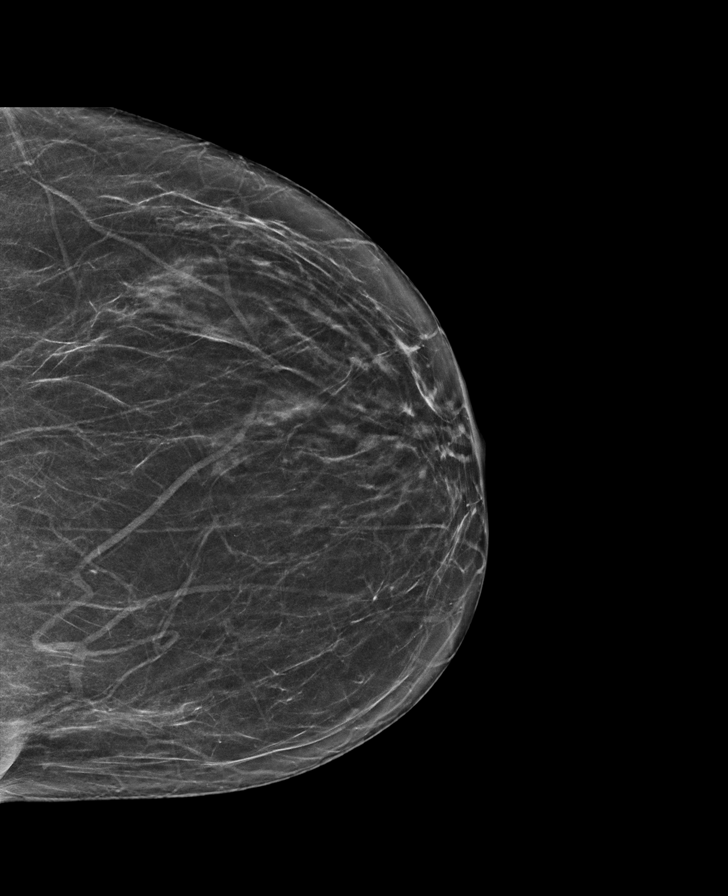

[L MLO synth-2D]
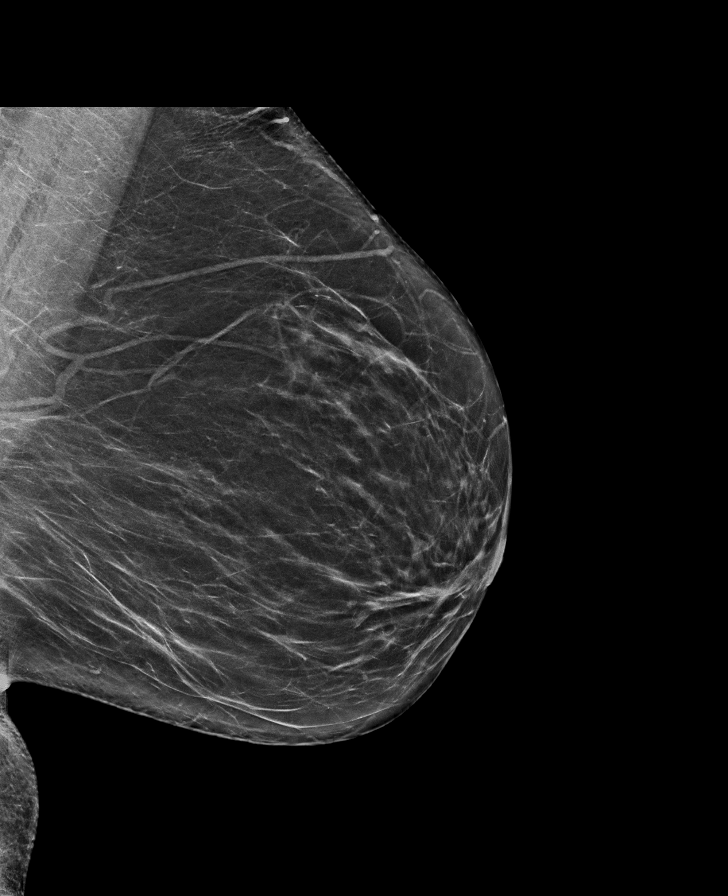

[L CC]
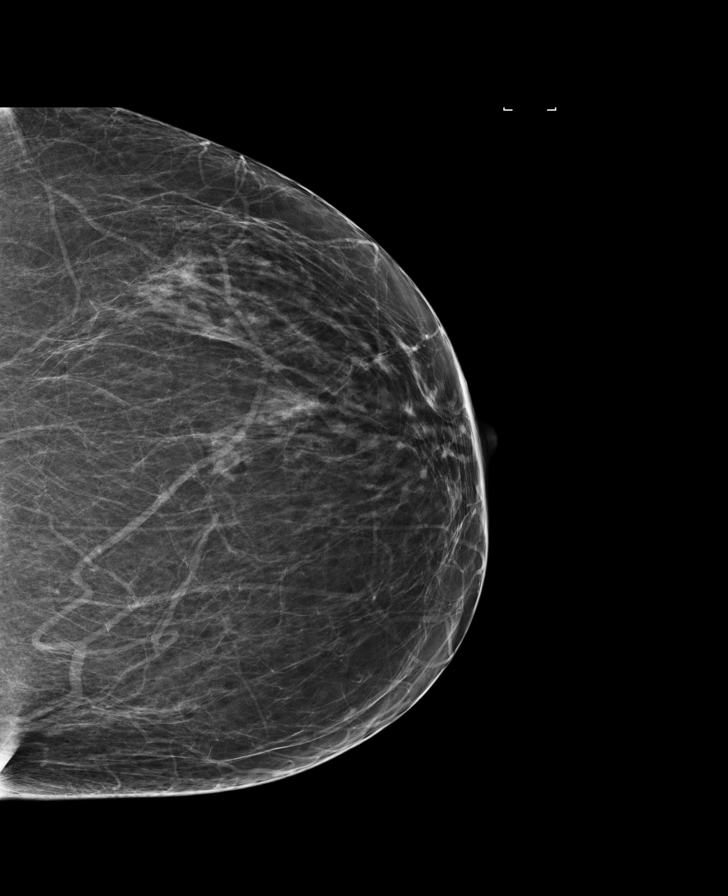

[R MLO synth-2D]
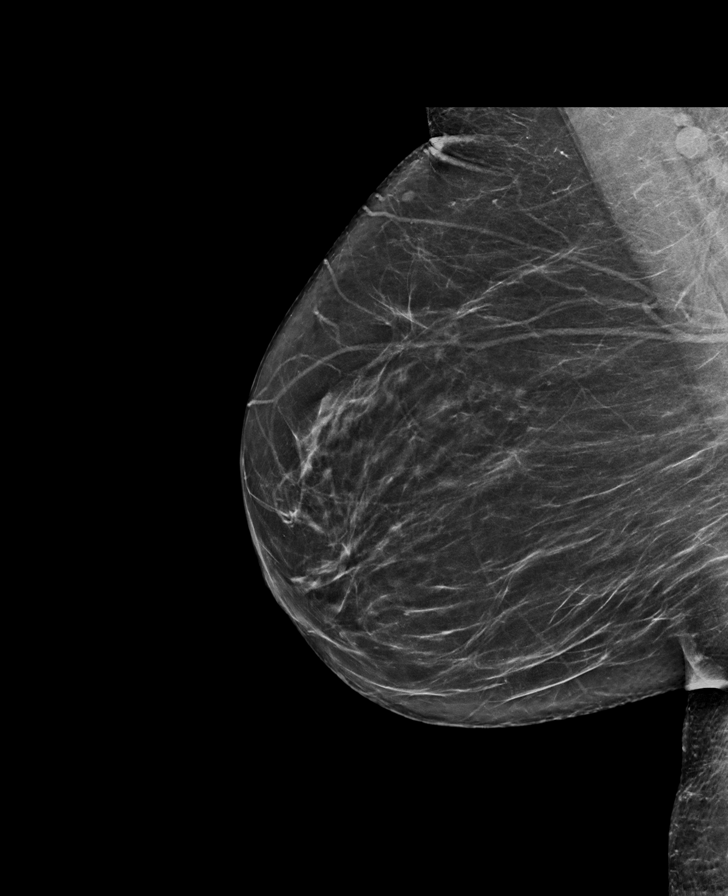

[R MLO]
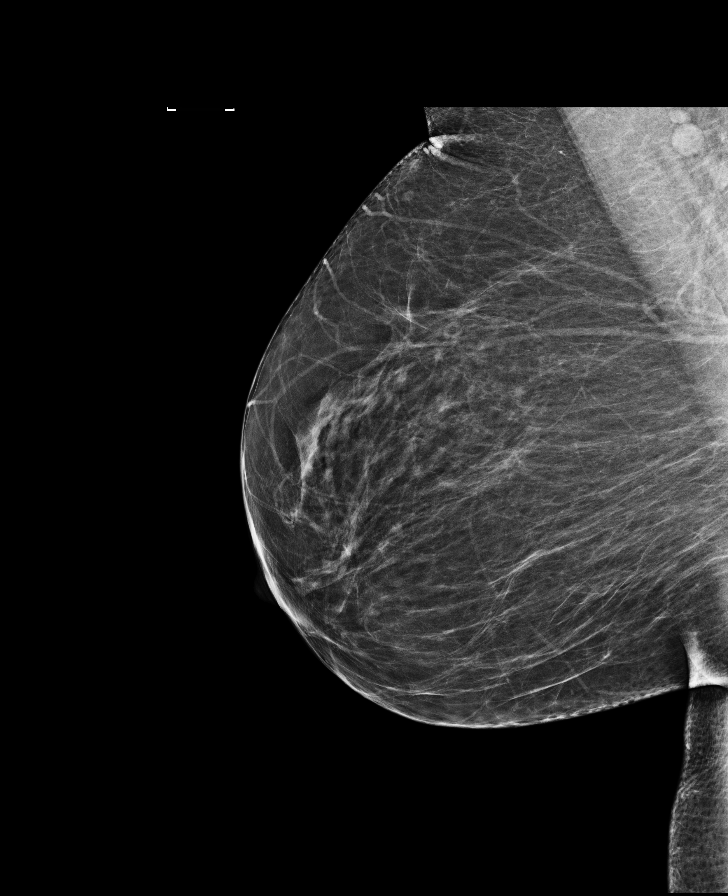

[L MLO]
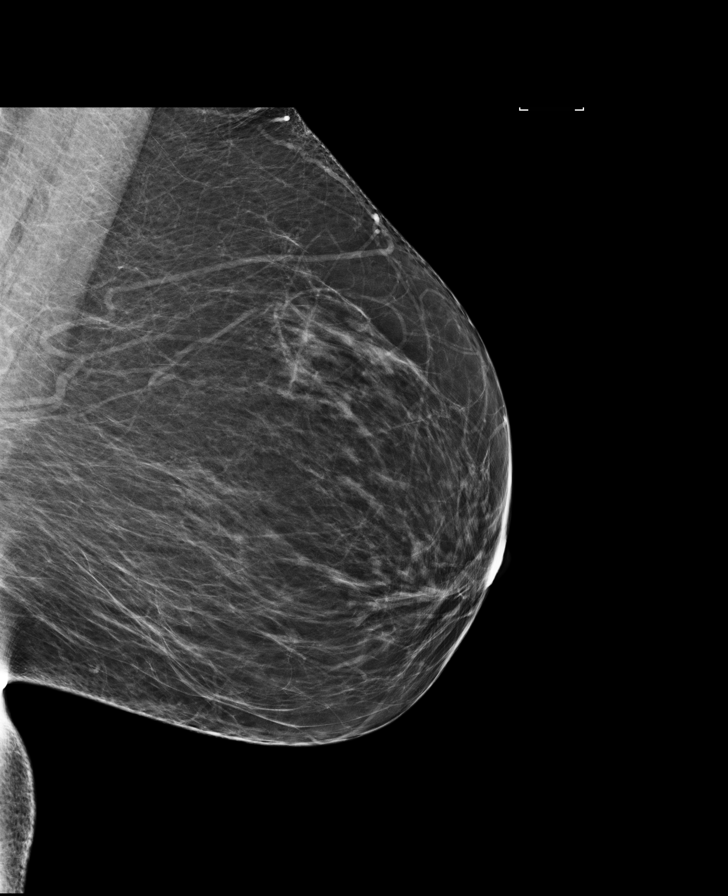

[R CC synth-2D]
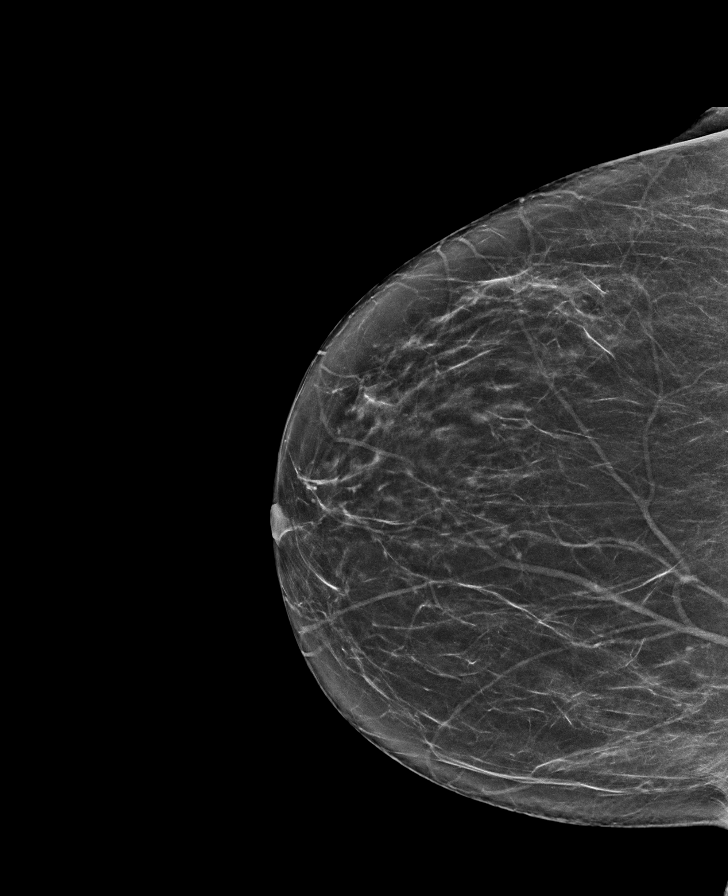

[8 of 28 positions shown; findings below may reference images not displayed]

ACR Breast Density Category b: There are scattered areas of
fibroglandular density.
FINDINGS: There are no findings suspicious for malignancy. Images were
processed with CAD.
IMPRESSION: No mammographic evidence of malignancy. A result letter of this
screening mammogram will be mailed directly to the patient.

RECOMMENDATION:
Screening mammogram in one year. (Code:97-6-RS4)

BI-RADS CATEGORY  1: Negative.

## 2019-05-11 ENCOUNTER — Other Ambulatory Visit: Payer: Self-pay

## 2019-05-11 ENCOUNTER — Encounter: Payer: Medicare HMO | Admitting: Family Medicine

## 2019-05-11 NOTE — Progress Notes (Signed)
   Subjective:    Patient ID: Katrina Dean, female    DOB: 15-Jan-1951, 68 y.o.   MRN: [id number]  Otalgia  There is pain in the left ear. This is a new problem. Episode onset: 3 days. There has been no fever. Associated symptoms comments: Left ear pain and drainage, stuffy nose.   Virtual Visit via Telephone Note  I connected with Katrina Dean on 05/11/19 at  4:10 PM EDT by telephone and verified that I am speaking with the correct person using two identifiers.  Location: Patient: home Provider: office   I discussed the limitations, risks, security and privacy concerns of performing an evaluation and management service by telephone and the availability of in person appointments. I also discussed with the patient that there may be a patient responsible charge related to this service. The patient expressed understanding and agreed to proceed.   History of Present Illness:    Observations/Objective:   Assessment and Plan:   Follow Up Instructions:    I discussed the assessment and treatment plan with the patient. The patient was provided an opportunity to ask questions and all were answered. The patient agreed with the plan and demonstrated an understanding of the instructions.   The patient was advised to call back or seek an in-person evaluation if the symptoms worsen or if the condition fails to improve as anticipated.  I provided 0 minutes of non-face-to-face time during this encounter.     Review of Systems  HENT: Positive for ear pain.        Objective:   Physical Exam        Assessment & Plan:   This encounter was created in error - please disregard.

## 2019-05-15 ENCOUNTER — Encounter: Payer: Self-pay | Admitting: Family Medicine

## 2019-05-15 ENCOUNTER — Ambulatory Visit (INDEPENDENT_AMBULATORY_CARE_PROVIDER_SITE_OTHER): Payer: Medicare HMO | Admitting: Family Medicine

## 2019-05-15 ENCOUNTER — Other Ambulatory Visit: Payer: Self-pay

## 2019-05-15 DIAGNOSIS — H2513 Age-related nuclear cataract, bilateral: Secondary | ICD-10-CM | POA: Diagnosis not present

## 2019-05-15 DIAGNOSIS — H3540 Unspecified peripheral retinal degeneration: Secondary | ICD-10-CM | POA: Diagnosis not present

## 2019-05-15 DIAGNOSIS — H353121 Nonexudative age-related macular degeneration, left eye, early dry stage: Secondary | ICD-10-CM | POA: Diagnosis not present

## 2019-05-15 DIAGNOSIS — J019 Acute sinusitis, unspecified: Secondary | ICD-10-CM

## 2019-05-15 DIAGNOSIS — H353113 Nonexudative age-related macular degeneration, right eye, advanced atrophic without subfoveal involvement: Secondary | ICD-10-CM | POA: Diagnosis not present

## 2019-05-15 MED ORDER — DOXYCYCLINE HYCLATE 100 MG PO TABS: 100.0000 mg | ORAL_TABLET | Freq: Two times a day (BID) | ORAL | 0 refills | Status: DC

## 2019-05-15 NOTE — Progress Notes (Signed)
   Subjective:    Patient ID: Katrina Dean, female    DOB: 1950/12/09, 68 y.o.   MRN: [id number]  Otalgia  There is pain in the left ear. This is a new problem. The current episode started in the past 7 days. Associated symptoms include coughing and rhinorrhea.   Left ear pain and drainage, stuffy nose and congestion. Signif  15 minutes was spent with patient today discussing healthcare issues which they came.  More than 50% of this visit-total duration of visit-was spent in counseling and coordination of care.  Please see diagnosis regarding the focus of this coordination and care icant head congestion drainage coughing ear pain present over the past week denies any fevers chills wheezing diarrhea PMH benign  Review of Systems  Constitutional: Negative for activity change and fever.  HENT: Positive for congestion, ear pain and rhinorrhea.   Eyes: Negative for discharge.  Respiratory: Positive for cough. Negative for shortness of breath and wheezing.   Cardiovascular: Negative for chest pain.       Objective:   Physical Exam   Today's visit was via telephone Physical exam was not possible for this visit      Assessment & Plan:  Probable sinusitis antibiotics prescribed warning signs discussed I doubt COVID warning signs discussed follow-up if problems

## 2019-06-09 DIAGNOSIS — H5213 Myopia, bilateral: Secondary | ICD-10-CM | POA: Diagnosis not present

## 2019-06-09 DIAGNOSIS — H2513 Age-related nuclear cataract, bilateral: Secondary | ICD-10-CM | POA: Diagnosis not present

## 2019-06-09 DIAGNOSIS — H353131 Nonexudative age-related macular degeneration, bilateral, early dry stage: Secondary | ICD-10-CM | POA: Diagnosis not present

## 2019-06-21 DIAGNOSIS — Z0100 Encounter for examination of eyes and vision without abnormal findings: Secondary | ICD-10-CM | POA: Diagnosis not present

## 2019-06-22 ENCOUNTER — Other Ambulatory Visit: Payer: Self-pay | Admitting: *Deleted

## 2019-06-22 DIAGNOSIS — Z20822 Contact with and (suspected) exposure to covid-19: Secondary | ICD-10-CM

## 2019-06-22 DIAGNOSIS — R6889 Other general symptoms and signs: Secondary | ICD-10-CM | POA: Diagnosis not present

## 2019-06-23 LAB — NOVEL CORONAVIRUS, NAA: SARS-CoV-2, NAA: DETECTED — AB

## 2019-07-19 ENCOUNTER — Encounter: Payer: Self-pay | Admitting: Family Medicine

## 2019-08-11 ENCOUNTER — Other Ambulatory Visit: Payer: Self-pay

## 2019-08-11 ENCOUNTER — Telehealth: Payer: Self-pay | Admitting: Family Medicine

## 2019-08-11 DIAGNOSIS — M810 Age-related osteoporosis without current pathological fracture: Secondary | ICD-10-CM

## 2019-08-11 DIAGNOSIS — Z20822 Contact with and (suspected) exposure to covid-19: Secondary | ICD-10-CM

## 2019-08-11 MED ORDER — ALENDRONATE SODIUM 70 MG PO TABS: 70.0000 mg | ORAL_TABLET | ORAL | 5 refills | Status: DC

## 2019-08-11 NOTE — Telephone Encounter (Signed)
Refills sent in

## 2019-08-11 NOTE — Addendum Note (Signed)
Addended by: Vicente Males on: 08/11/2019 03:57 PM   Modules accepted: Orders

## 2019-08-11 NOTE — Telephone Encounter (Signed)
Pharmacy requesting refill on Alendronate Sodium 70 mg tablet. Take one tablet by mouth every 7 days. Pt last seen 05/15/2019 for rhinosinusitis. Please advise. Thank you

## 2019-08-11 NOTE — Telephone Encounter (Signed)
May have 6 months old refill

## 2019-08-12 LAB — NOVEL CORONAVIRUS, NAA: SARS-CoV-2, NAA: NOT DETECTED

## 2019-08-20 ENCOUNTER — Other Ambulatory Visit: Payer: Self-pay | Admitting: Family Medicine

## 2019-09-26 ENCOUNTER — Other Ambulatory Visit: Payer: Self-pay | Admitting: Allergy & Immunology

## 2019-12-05 DIAGNOSIS — H3540 Unspecified peripheral retinal degeneration: Secondary | ICD-10-CM | POA: Diagnosis not present

## 2019-12-05 DIAGNOSIS — H353113 Nonexudative age-related macular degeneration, right eye, advanced atrophic without subfoveal involvement: Secondary | ICD-10-CM | POA: Diagnosis not present

## 2019-12-05 DIAGNOSIS — H2513 Age-related nuclear cataract, bilateral: Secondary | ICD-10-CM | POA: Diagnosis not present

## 2019-12-13 ENCOUNTER — Telehealth: Payer: Self-pay | Admitting: Family Medicine

## 2019-12-13 DIAGNOSIS — H1045 Other chronic allergic conjunctivitis: Secondary | ICD-10-CM | POA: Diagnosis not present

## 2019-12-13 DIAGNOSIS — R05 Cough: Secondary | ICD-10-CM | POA: Diagnosis not present

## 2019-12-13 DIAGNOSIS — Z79899 Other long term (current) drug therapy: Secondary | ICD-10-CM

## 2019-12-13 DIAGNOSIS — E785 Hyperlipidemia, unspecified: Secondary | ICD-10-CM

## 2019-12-13 DIAGNOSIS — R5383 Other fatigue: Secondary | ICD-10-CM

## 2019-12-13 DIAGNOSIS — R69 Illness, unspecified: Secondary | ICD-10-CM | POA: Diagnosis not present

## 2019-12-13 DIAGNOSIS — J309 Allergic rhinitis, unspecified: Secondary | ICD-10-CM | POA: Diagnosis not present

## 2019-12-13 DIAGNOSIS — M810 Age-related osteoporosis without current pathological fracture: Secondary | ICD-10-CM

## 2019-12-13 NOTE — Telephone Encounter (Signed)
Patient has physical schedule with Hoyle Sauer 5/14 and needing labs done

## 2019-12-19 NOTE — Telephone Encounter (Signed)
Please order: Met 7, Liver, Lipid, TSH. She has osteoporosis, so see if we can order a vitamin D. Thanks!

## 2019-12-19 NOTE — Telephone Encounter (Signed)
Blood work ordered in Epic. Patient notified. 

## 2019-12-20 DIAGNOSIS — L821 Other seborrheic keratosis: Secondary | ICD-10-CM | POA: Diagnosis not present

## 2019-12-20 DIAGNOSIS — D485 Neoplasm of uncertain behavior of skin: Secondary | ICD-10-CM | POA: Diagnosis not present

## 2019-12-20 DIAGNOSIS — D225 Melanocytic nevi of trunk: Secondary | ICD-10-CM | POA: Diagnosis not present

## 2019-12-20 DIAGNOSIS — L570 Actinic keratosis: Secondary | ICD-10-CM | POA: Diagnosis not present

## 2019-12-20 DIAGNOSIS — D1801 Hemangioma of skin and subcutaneous tissue: Secondary | ICD-10-CM | POA: Diagnosis not present

## 2019-12-28 DIAGNOSIS — D485 Neoplasm of uncertain behavior of skin: Secondary | ICD-10-CM | POA: Diagnosis not present

## 2019-12-28 DIAGNOSIS — L988 Other specified disorders of the skin and subcutaneous tissue: Secondary | ICD-10-CM | POA: Diagnosis not present

## 2020-01-31 DIAGNOSIS — R5383 Other fatigue: Secondary | ICD-10-CM | POA: Diagnosis not present

## 2020-01-31 DIAGNOSIS — E785 Hyperlipidemia, unspecified: Secondary | ICD-10-CM | POA: Diagnosis not present

## 2020-01-31 DIAGNOSIS — M810 Age-related osteoporosis without current pathological fracture: Secondary | ICD-10-CM | POA: Diagnosis not present

## 2020-01-31 DIAGNOSIS — Z79899 Other long term (current) drug therapy: Secondary | ICD-10-CM | POA: Diagnosis not present

## 2020-02-01 LAB — HEPATIC FUNCTION PANEL
ALT: 13 IU/L (ref 0–32)
AST: 20 IU/L (ref 0–40)
Albumin: 4.8 g/dL (ref 3.8–4.8)
Alkaline Phosphatase: 63 IU/L (ref 39–117)
Bilirubin Total: 0.4 mg/dL (ref 0.0–1.2)
Bilirubin, Direct: 0.11 mg/dL (ref 0.00–0.40)
Total Protein: 7.5 g/dL (ref 6.0–8.5)

## 2020-02-01 LAB — BASIC METABOLIC PANEL
BUN/Creatinine Ratio: 13 (ref 12–28)
BUN: 10 mg/dL (ref 8–27)
CO2: 23 mmol/L (ref 20–29)
Calcium: 9.9 mg/dL (ref 8.7–10.3)
Chloride: 100 mmol/L (ref 96–106)
Creatinine, Ser: 0.8 mg/dL (ref 0.57–1.00)
GFR calc Af Amer: 88 mL/min/{1.73_m2} (ref >59)
GFR calc non Af Amer: 77 mL/min/{1.73_m2} (ref >59)
Glucose: 91 mg/dL (ref 65–99)
Potassium: 4.8 mmol/L (ref 3.5–5.2)
Sodium: 139 mmol/L (ref 134–144)

## 2020-02-01 LAB — TSH: TSH: 1.41 u[IU]/mL (ref 0.450–4.500)

## 2020-02-01 LAB — LIPID PANEL
Chol/HDL Ratio: 4.7 ratio — ABNORMAL HIGH (ref 0.0–4.4)
Cholesterol, Total: 255 mg/dL — ABNORMAL HIGH (ref 100–199)
HDL: 54 mg/dL (ref >39)
LDL Chol Calc (NIH): 177 mg/dL — ABNORMAL HIGH (ref 0–99)
Triglycerides: 132 mg/dL (ref 0–149)
VLDL Cholesterol Cal: 24 mg/dL (ref 5–40)

## 2020-02-01 LAB — VITAMIN D 25 HYDROXY (VIT D DEFICIENCY, FRACTURES): Vit D, 25-Hydroxy: 26.6 ng/mL — ABNORMAL LOW (ref 30.0–100.0)

## 2020-02-14 ENCOUNTER — Other Ambulatory Visit: Payer: Medicare HMO

## 2020-02-14 ENCOUNTER — Telehealth (INDEPENDENT_AMBULATORY_CARE_PROVIDER_SITE_OTHER): Payer: Medicare HMO | Admitting: Family Medicine

## 2020-02-14 ENCOUNTER — Telehealth: Payer: Self-pay | Admitting: Family Medicine

## 2020-02-14 ENCOUNTER — Other Ambulatory Visit: Payer: Self-pay

## 2020-02-14 ENCOUNTER — Encounter: Payer: Self-pay | Admitting: Family Medicine

## 2020-02-14 DIAGNOSIS — J301 Allergic rhinitis due to pollen: Secondary | ICD-10-CM | POA: Diagnosis not present

## 2020-02-14 MED ORDER — DOXYCYCLINE HYCLATE 100 MG PO TABS: 100.0000 mg | ORAL_TABLET | Freq: Two times a day (BID) | ORAL | 0 refills | Status: DC

## 2020-02-14 MED ORDER — PREDNISONE 10 MG PO TABS: 10.0000 mg | ORAL_TABLET | Freq: Two times a day (BID) | ORAL | 0 refills | Status: DC

## 2020-02-14 NOTE — Progress Notes (Signed)
   Subjective:    Patient ID: Katrina Dean, female    DOB: 04/15/1951, 69 y.o.   MRN: [id number]  Sinusitis This is a recurrent problem. Associated symptoms include coughing. (Itchy watery eyes, runny nose, productive of cough(foamy like), no fever, no headache, no sore throat, congestion.  Pt having issues sleeping due to coughing is worse. ) Past treatments include acetaminophen (Robitussin DM, inhaler, Zytrec, Nasacort). The treatment provided mild relief.    Patient got outside quite a bit Friday and Saturday developed substantial allergy reaction.  Generally that settles in the sinuses, and then into the chest.  Patient states Doxy prevents that  Patient also notes she had COVID-19 last year  Review of Systems  Respiratory: Positive for cough.    Virtual Visit via Telephone Note  I connected with Winnie A Hine on 02/14/20 at  3:00 PM EDT by telephone and verified that I am speaking with the correct person using two identifiers.  Location: Patient: home Provider: office   I discussed the limitations, risks, security and privacy concerns of performing an evaluation and management service by telephone and the availability of in person appointments. I also discussed with the patient that there may be a patient responsible charge related to this service. The patient expressed understanding and agreed to proceed.   History of Present Illness:    Observations/Objective:   Assessment and Plan:   Follow Up Instructions:    I discussed the assessment and treatment plan with the patient. The patient was provided an opportunity to ask questions and all were answered. The patient agreed with the plan and demonstrated an understanding of the instructions.   The patient was advised to call back or seek an in-person evaluation if the symptoms worsen or if the condition fails to improve as anticipated.  I provided 20 minutes of non-face-to-face time during this  encounter.        Objective:   Physical Exam  Virtual      Assessment & Plan:  Impression probable allergic rhinitis with possible cell secondary element of sinusitis/bronchitis low-dose prednisone patient reacts poorly to anything higher.  Maintain other meds.  Add Doxy 100 twice daily 10 days

## 2020-02-14 NOTE — Telephone Encounter (Signed)
[name], huard are scheduled for a virtual visit with your provider today.    Just as we do with appointments in the office, we must obtain your consent to participate.  Your consent will be active for this visit and any virtual visit you may have with one of our providers in the next 365 days.    If you have a MyChart account, I can also send a copy of this consent to you electronically.  All virtual visits are billed to your insurance company just like a traditional visit in the office.  As this is a virtual visit, video technology does not allow for your provider to perform a traditional examination.  This may limit your provider's ability to fully assess your condition.  If your provider identifies any concerns that need to be evaluated in person or the need to arrange testing such as labs, EKG, etc, we will make arrangements to do so.    Although advances in technology are sophisticated, we cannot ensure that it will always work on either your end or our end.  If the connection with a video visit is poor, we may have to switch to a telephone visit.  With either a video or telephone visit, we are not always able to ensure that we have a secure connection.   I need to obtain your verbal consent now.   Are you willing to proceed with your visit today?   Katrina Dean has provided verbal consent on 02/14/2020 for a virtual visit (video or telephone).   Vicente Males, LPN [id number]  [id number] PM

## 2020-02-16 ENCOUNTER — Other Ambulatory Visit: Payer: Self-pay | Admitting: Nurse Practitioner

## 2020-02-16 ENCOUNTER — Other Ambulatory Visit: Payer: Self-pay

## 2020-02-16 ENCOUNTER — Ambulatory Visit (INDEPENDENT_AMBULATORY_CARE_PROVIDER_SITE_OTHER): Payer: Medicare HMO | Admitting: Nurse Practitioner

## 2020-02-16 VITALS — BP 138/84 | Temp 97.6°F | Ht 62.0 in | Wt 166.4 lb

## 2020-02-16 DIAGNOSIS — Z780 Asymptomatic menopausal state: Secondary | ICD-10-CM

## 2020-02-16 DIAGNOSIS — E785 Hyperlipidemia, unspecified: Secondary | ICD-10-CM | POA: Diagnosis not present

## 2020-02-16 DIAGNOSIS — Z79899 Other long term (current) drug therapy: Secondary | ICD-10-CM | POA: Diagnosis not present

## 2020-02-16 DIAGNOSIS — Z01419 Encounter for gynecological examination (general) (routine) without abnormal findings: Secondary | ICD-10-CM | POA: Diagnosis not present

## 2020-02-16 DIAGNOSIS — M810 Age-related osteoporosis without current pathological fracture: Secondary | ICD-10-CM | POA: Diagnosis not present

## 2020-02-16 DIAGNOSIS — F419 Anxiety disorder, unspecified: Secondary | ICD-10-CM

## 2020-02-16 DIAGNOSIS — R69 Illness, unspecified: Secondary | ICD-10-CM | POA: Diagnosis not present

## 2020-02-16 DIAGNOSIS — Z1231 Encounter for screening mammogram for malignant neoplasm of breast: Secondary | ICD-10-CM

## 2020-02-16 DIAGNOSIS — R5383 Other fatigue: Secondary | ICD-10-CM

## 2020-02-16 MED ORDER — ROSUVASTATIN CALCIUM 5 MG PO TABS: 5.0000 mg | ORAL_TABLET | Freq: Every day | ORAL | 2 refills | Status: DC

## 2020-02-16 MED ORDER — ESCITALOPRAM OXALATE 10 MG PO TABS: 10.0000 mg | ORAL_TABLET | Freq: Every day | ORAL | 0 refills | Status: DC

## 2020-02-16 NOTE — Progress Notes (Signed)
   Subjective:    Patient ID: Katrina Dean, female    DOB: Feb 14, 1951, 69 y.o.   MRN: [id number]  HPI   AWV- Annual Wellness Visit  The patient was seen for their annual wellness visit. The patient's past medical history, surgical history, and family history were reviewed. Pertinent vaccines were reviewed ( tetanus, pneumonia, shingles, flu) The patient's medication list was reviewed and updated.  The height and weight were entered.  BMI recorded in electronic record elsewhere  Cognitive screening was completed. Outcome of Mini - Cog: Pass   Falls /depression screening electronically recorded within record elsewhere  Current tobacco usage: None (All patients who use tobacco were given written and verbal information on quitting)  Recent listing of emergency department/hospitalizations over the past year were reviewed.  current specialist the patient sees on a regular basis: None   Medicare annual wellness visit patient questionnaire was reviewed.  A written screening schedule for the patient for the next 5-10 years was given. Appropriate discussion of followup regarding next visit was discussed.    Review of Systems     Objective:   Physical Exam        Assessment & Plan:

## 2020-02-16 NOTE — Patient Instructions (Addendum)
Consider Pnemonia vaccine  Schedule mammogram and DEXA scan for September Start on a low dose Statin for cholesterol  Start on low dose Lexapro for anxiety and OCD Repeat labs in 2 months

## 2020-02-16 NOTE — Progress Notes (Addendum)
Subjective:    Patient ID: Katrina Dean, female    DOB: 1951-02-14, 69 y.o.   MRN: [id number]  HPI Presents today for her annual physical. Has regular eye exams. Has not had dental exam in a while, recommend that she get this before the end of the year. Is due for her mammogram and bone density scan in September. Married, same sexual partner. Had a hysterectomy with BSO, not for cancer. Discussed colonoscopy and she declines. She walks and does weight bearing exercises outside. "Tries to stick to a healthy diet". Expresses that her anxiety and OCD has increased in the last year due to multiple stressors. Her daughter-in-law takes Lexapro and would like to try this. Was on Fosamax at one point but stopped due to GI upset. Did not contact office or start other medication. Is due for DEXA which she will do at the same time as her mammogram for convenience. Gets regular skin cancer screening.    Review of Systems  Constitutional: Positive for fatigue. Negative for activity change and unexpected weight change.  Respiratory: Positive for wheezing. Negative for cough, chest tightness and shortness of breath.        SOB recently with a flare up in allergies. Denies currently Reports very mild wheezing at times due to allergies  Cardiovascular: Negative for chest pain.  Gastrointestinal: Negative for abdominal pain, blood in stool, constipation, diarrhea, nausea and vomiting.  Genitourinary: Negative for difficulty urinating, dysuria, enuresis, frequency, genital sores, pelvic pain, urgency, vaginal discharge and vaginal pain.  Psychiatric/Behavioral: Negative for self-injury and suicidal ideas.       Has feelings of anxiety at times, OCD   Depression screen [facility] 2/9 02/16/2020 05/03/2017 12/26/2015  Decreased Interest 0 1 0  Down, Depressed, Hopeless 1 1 0  PHQ - 2 Score 1 2 0  Altered sleeping 1 0 -  Tired, decreased energy 1 1 -  Change in appetite 1 - -  Feeling bad or failure about yourself   1 0 -  Trouble concentrating 0 0 -  Moving slowly or fidgety/restless 0 0 -  Suicidal thoughts 0 0 -  PHQ-9 Score 5 3 -  Difficult doing work/chores Somewhat difficult - -       GAD 7 : Generalized Anxiety Score 02/16/2020 02/16/2020  Nervous, Anxious, on Edge 2 2  Control/stop worrying 3 3  Worry too much - different things 3 3  Trouble relaxing 2 2  Restless 2 2  Easily annoyed or irritable 3 3  Afraid - awful might happen 0 0  Total GAD 7 Score 15 15  Anxiety Difficulty Somewhat difficult Somewhat difficult    Objective:   Physical Exam Constitutional:      Appearance: Normal appearance.  Neck:     Comments: Thyroid non tender, no mass or goiter noted.  Cardiovascular:     Rate and Rhythm: Normal rate and regular rhythm.     Heart sounds: Normal heart sounds.  Pulmonary:     Breath sounds: Wheezing present.     Comments: Occasional mild expiratory wheeze noted RUL posterior only. Cleared after coughing. Otherwise clear.  Chest:     Breasts:        Right: No inverted nipple, mass or tenderness.        Left: No inverted nipple, mass or tenderness.     Comments: Scar upper left breast Abdominal:     General: There is no distension.     Palpations: Abdomen is soft. There is no  mass.     Tenderness: There is no abdominal tenderness.  Genitourinary:    Vagina: No vaginal discharge.     Comments: External GU: no rashes or lesions. Vagina: tissue pink with no discharge. Bimanual: no tenderness or obvious masses.  Lymphadenopathy:     Cervical: No cervical adenopathy.     Upper Body:     Right upper body: No supraclavicular, axillary or pectoral adenopathy.     Left upper body: No supraclavicular, axillary or pectoral adenopathy.  Skin:    General: Skin is warm and dry.     Comments: Scar tissue noted left upper breast from previous dermatology biopsy.   Neurological:     Mental Status: She is alert.  Psychiatric:        Mood and Affect: Mood normal.        Behavior:  Behavior normal.        Thought Content: Thought content normal.        Judgment: Judgment normal.     Comments: Tearful     Recent Results (from the past 2160 hour(s))  Lipid panel     Status: Abnormal   Collection Time: 01/31/20  9:12 AM  Result Value Ref Range   Cholesterol, Total 255 (H) 100 - 199 mg/dL   Triglycerides 132 0 - 149 mg/dL   HDL 54 >39 mg/dL   VLDL Cholesterol Cal 24 5 - 40 mg/dL   LDL Chol Calc (NIH) 177 (H) 0 - 99 mg/dL   Chol/HDL Ratio 4.7 (H) 0.0 - 4.4 ratio    Comment:                                   T. Chol/HDL Ratio                                             Men  Women                               1/2 Avg.Risk  3.4    3.3                                   Avg.Risk  5.0    4.4                                2X Avg.Risk  9.6    7.1                                3X Avg.Risk 23.4   11.0   Hepatic function panel     Status: None   Collection Time: 01/31/20  9:12 AM  Result Value Ref Range   Total Protein 7.5 6.0 - 8.5 g/dL   Albumin 4.8 3.8 - 4.8 g/dL   Bilirubin Total 0.4 0.0 - 1.2 mg/dL   Bilirubin, Direct 0.11 0.00 - 0.40 mg/dL   Alkaline Phosphatase 63 39 - 117 IU/L   AST 20 0 - 40 IU/L   ALT 13 0 - 32 IU/L  Basic metabolic panel  Status: None   Collection Time: 01/31/20  9:12 AM  Result Value Ref Range   Glucose 91 65 - 99 mg/dL   BUN 10 8 - 27 mg/dL   Creatinine, Ser 0.80 0.57 - 1.00 mg/dL   GFR calc non Af Amer 77 >59 mL/min/1.73   GFR calc Af Amer 88 >59 mL/min/1.73    Comment: **[facility] currently reports eGFR in compliance with the current**   recommendations of the [employer]. [facility] will   update reporting as new guidelines are published from the NKF-ASN   Task force.    BUN/Creatinine Ratio 13 12 - 28   Sodium 139 134 - 144 mmol/L   Potassium 4.8 3.5 - 5.2 mmol/L   Chloride 100 96 - 106 mmol/L   CO2 23 20 - 29 mmol/L   Calcium 9.9 8.7 - 10.3 mg/dL  TSH     Status: None   Collection Time: 01/31/20  9:12 AM    Result Value Ref Range   TSH 1.410 0.450 - 4.500 uIU/mL  VITAMIN D 25 Hydroxy (Vit-D Deficiency, Fractures)     Status: Abnormal   Collection Time: 01/31/20  9:12 AM  Result Value Ref Range   Vit D, 25-Hydroxy 26.6 (L) 30.0 - 100.0 ng/mL    Comment: Vitamin D deficiency has been defined by the [place] and an Endocrine Society practice guideline as a level of serum 25-OH vitamin D less than 20 ng/mL (1,2). The Endocrine Society went on to further define vitamin D insufficiency as a level between 21 and 29 ng/mL (2). 1. IOM (Institute of Medicine). 2010. Dietary reference    intakes for calcium and D. [place]: The    [employer]. 2. Holick MF, Binkley Ferdinand, Bischoff-Ferrari HA, et al.    Evaluation, treatment, and prevention of vitamin D    deficiency: an Endocrine Society clinical practice    guideline. JCEM. 2011 Jul; 96(7):1911-30.    Reviewed labs with patient during visit.        Assessment & Plan:   Problem List Items Addressed This Visit      Musculoskeletal and Integument   Osteoporosis   Relevant Orders   DG Bone Density     Other   Hyperlipemia   Relevant Medications   rosuvastatin (CRESTOR) 5 MG tablet   Anxiety   Relevant Medications   escitalopram (LEXAPRO) 10 MG tablet    Other Visit Diagnoses    Well woman exam    -  Primary   High risk medication use       Relevant Orders   Hepatic function panel   Fatigue, unspecified type       Relevant Orders   Lipid panel   Post-menopausal       Relevant Orders   DG Bone Density     Meds ordered this encounter  Medications  . escitalopram (LEXAPRO) 10 MG tablet    Sig: Take 1 tablet (10 mg total) by mouth daily.    Dispense:  30 tablet    Refill:  0    Order Specific Question:   Supervising Provider    Answer:   Sallee Lange A [[id number]]  . rosuvastatin (CRESTOR) 5 MG tablet    Sig: Take 1 tablet (5 mg total) by mouth daily. For cholesterol    Dispense:  30 tablet     Refill:  2    Order Specific Question:   Supervising Provider    Answer:   Sallee Lange A [[id number]]  Consider Pnemonia and COVID vaccines; defers at this time.  Schedule mammogram and DEXA scan for September, once bone scan results come back we will consider medication at that time.  Consider colonoscopy; defers at this time.  Start on a low dose Statin for cholesterol  Start on low dose Lexapro for anxiety and OCD. DC either med and contact office if any problems. Also discussed resources to help with her personal stress.  Repeat labs in 2 months.  Recheck in one month.

## 2020-02-18 ENCOUNTER — Encounter: Payer: Self-pay | Admitting: Nurse Practitioner

## 2020-03-09 ENCOUNTER — Other Ambulatory Visit: Payer: Self-pay | Admitting: Nurse Practitioner

## 2020-03-11 NOTE — Telephone Encounter (Signed)
Wellness 02/16/20

## 2020-03-27 ENCOUNTER — Ambulatory Visit: Payer: Medicare HMO | Attending: Internal Medicine

## 2020-03-27 ENCOUNTER — Other Ambulatory Visit: Payer: Medicare HMO

## 2020-03-27 ENCOUNTER — Other Ambulatory Visit: Payer: Self-pay

## 2020-03-27 DIAGNOSIS — Z20822 Contact with and (suspected) exposure to covid-19: Secondary | ICD-10-CM

## 2020-03-28 LAB — SARS-COV-2, NAA 2 DAY TAT

## 2020-03-28 LAB — NOVEL CORONAVIRUS, NAA: SARS-CoV-2, NAA: NOT DETECTED

## 2020-04-04 ENCOUNTER — Ambulatory Visit: Payer: Medicare HMO | Admitting: Nurse Practitioner

## 2020-04-19 ENCOUNTER — Telehealth: Payer: Self-pay | Admitting: Nurse Practitioner

## 2020-04-19 ENCOUNTER — Telehealth (INDEPENDENT_AMBULATORY_CARE_PROVIDER_SITE_OTHER): Payer: Medicare HMO | Admitting: Nurse Practitioner

## 2020-04-19 ENCOUNTER — Encounter: Payer: Self-pay | Admitting: Nurse Practitioner

## 2020-04-19 ENCOUNTER — Other Ambulatory Visit: Payer: Self-pay

## 2020-04-19 DIAGNOSIS — F419 Anxiety disorder, unspecified: Secondary | ICD-10-CM

## 2020-04-19 DIAGNOSIS — E785 Hyperlipidemia, unspecified: Secondary | ICD-10-CM | POA: Diagnosis not present

## 2020-04-19 DIAGNOSIS — R69 Illness, unspecified: Secondary | ICD-10-CM | POA: Diagnosis not present

## 2020-04-19 MED ORDER — ROSUVASTATIN CALCIUM 5 MG PO TABS: 5.0000 mg | ORAL_TABLET | Freq: Every day | ORAL | 2 refills | Status: DC

## 2020-04-19 MED ORDER — CLONAZEPAM 0.5 MG PO TABS: ORAL_TABLET | ORAL | 2 refills | Status: DC

## 2020-04-19 NOTE — Progress Notes (Signed)
Phone visit Subjective:    Patient ID: Katrina Dean, female    DOB: 1950/11/24, 69 y.o.   MRN: [id number]  HPI Pt needing medication follow up. Pt was taken off Klonpin to be put on lexapro 10 mg. Pt states that the Lexapro is causing bad/vivid dreams and she is unable to control her anxiety like she use to be able to with Klonopin. Pt was on Crestor 5 mg daily but stopped that due to feet pain. Pt would like provider to call in Klonopin if possible.   Virtual Visit via Telephone Note  I connected with Katrina Dean on 04/19/20 at 11:00 AM EDT by telephone and verified that I am speaking with the correct person using two identifiers.  Location: Patient: home Provider: office   I discussed the limitations, risks, security and privacy concerns of performing an evaluation and management service by telephone and the availability of in person appointments. I also discussed with the patient that there may be a patient responsible charge related to this service. The patient expressed understanding and agreed to proceed.   History of Present Illness: See notes above.  Patient also has experienced significant apathy while taking Lexapro.  Does not feel this helps her anxiety.  Would like to switch back to low-dose Klonopin to take as needed.   Observations/Objective: Today's visit was via telephone Physical exam was not possible for this visit Alert, oriented.  Calm cheerful speech.  Thoughts logical coherent and relevant.  Assessment and Plan: Problem List Items Addressed This Visit      Other   Anxiety - Primary   Hyperlipemia   Relevant Medications   rosuvastatin (CRESTOR) 5 MG tablet     Meds ordered this encounter  Medications  . rosuvastatin (CRESTOR) 5 MG tablet    Sig: Take 1 tablet (5 mg total) by mouth daily. For cholesterol    Dispense:  30 tablet    Refill:  2    Order Specific Question:   Supervising Provider    Answer:   Sallee Lange A [[id number]]  . clonazePAM  (KLONOPIN) 0.5 MG tablet    Sig: Take 1/2-1 tab po BID prn severe anxiety    Dispense:  30 tablet    Refill:  2    Order Specific Question:   Supervising Provider    Answer:   Sallee Lange A [[id number]]     Follow Up Instructions: Start taking Crestor Monday Wednesday Friday or half tab daily to see if this will cause less foot pain.  Repeat lipid and liver profile about 8 weeks after starting new dosing.  Labs have already been ordered. DC Lexapro.  Start back on low-dose Klonopin, only take with severe anxiety.  If patient finds she needs this every day, to call back so we can discuss daily medication. Return in about 3 months (around 07/20/2020) for anxiety recheck. Call back sooner if needed.   I discussed the assessment and treatment plan with the patient. The patient was provided an opportunity to ask questions and all were answered. The patient agreed with the plan and demonstrated an understanding of the instructions.   The patient was advised to call back or seek an in-person evaluation if the symptoms worsen or if the condition fails to improve as anticipated.  I provided 15 minutes of non-face-to-face time during this encounter.       Review of Systems     Objective:   Physical Exam        Assessment &  Plan:

## 2020-04-19 NOTE — Telephone Encounter (Signed)
[name], meding are scheduled for a virtual visit with your provider today.    Just as we do with appointments in the office, we must obtain your consent to participate.  Your consent will be active for this visit and any virtual visit you may have with one of our providers in the next 365 days.    If you have a MyChart account, I can also send a copy of this consent to you electronically.  All virtual visits are billed to your insurance company just like a traditional visit in the office.  As this is a virtual visit, video technology does not allow for your provider to perform a traditional examination.  This may limit your provider's ability to fully assess your condition.  If your provider identifies any concerns that need to be evaluated in person or the need to arrange testing such as labs, EKG, etc, we will make arrangements to do so.    Although advances in technology are sophisticated, we cannot ensure that it will always work on either your end or our end.  If the connection with a video visit is poor, we may have to switch to a telephone visit.  With either a video or telephone visit, we are not always able to ensure that we have a secure connection.   I need to obtain your verbal consent now.   Are you willing to proceed with your visit today?   [name] has provided verbal consent on 04/19/2020 for a virtual visit (video or telephone).   Vicente Males, LPN 06/20/9449  38:88 AM

## 2020-04-21 ENCOUNTER — Encounter: Payer: Self-pay | Admitting: Nurse Practitioner

## 2020-05-09 ENCOUNTER — Other Ambulatory Visit: Payer: Self-pay | Admitting: Nurse Practitioner

## 2020-05-10 ENCOUNTER — Ambulatory Visit (INDEPENDENT_AMBULATORY_CARE_PROVIDER_SITE_OTHER): Payer: Medicare HMO | Admitting: Family Medicine

## 2020-05-10 ENCOUNTER — Other Ambulatory Visit: Payer: Self-pay

## 2020-05-10 DIAGNOSIS — R0981 Nasal congestion: Secondary | ICD-10-CM | POA: Diagnosis not present

## 2020-05-10 DIAGNOSIS — J019 Acute sinusitis, unspecified: Secondary | ICD-10-CM

## 2020-05-10 MED ORDER — DOXYCYCLINE HYCLATE 100 MG PO TABS: 100.0000 mg | ORAL_TABLET | Freq: Two times a day (BID) | ORAL | 0 refills | Status: DC

## 2020-05-10 NOTE — Progress Notes (Signed)
   Subjective:    Patient ID: Katrina Dean, female    DOB: 07/15/1951, 69 y.o.   MRN: [id number]  Sinus Problem This is a new problem. The current episode started in the past 7 days. Associated symptoms include congestion and sinus pressure. (Ears popping)   Significant head congestion drainage ears popping denies high fever chills denies wheezing difficulty breathing.  Is contemplating getting Covid vaccine we did discuss this   Review of Systems  HENT: Positive for congestion and sinus pressure.    Please see above    Objective:   Physical Exam Some sinus tenderness eardrums are normal lungs are clear heart is regular patient nontoxic O2 saturation 97%       Assessment & Plan:  Viral syndrome Acute rhinosinusitis Antibiotic prescribed warning signs discussed follow-up if problems Covid testing ordered patient nontoxic

## 2020-05-11 LAB — NOVEL CORONAVIRUS, NAA: SARS-CoV-2, NAA: NOT DETECTED

## 2020-05-11 LAB — SARS-COV-2, NAA 2 DAY TAT

## 2020-05-11 LAB — SPECIMEN STATUS REPORT

## 2020-06-17 ENCOUNTER — Ambulatory Visit: Payer: Medicare HMO

## 2020-06-17 ENCOUNTER — Other Ambulatory Visit: Payer: Medicare HMO

## 2020-06-27 ENCOUNTER — Other Ambulatory Visit: Payer: Self-pay | Admitting: *Deleted

## 2020-07-15 ENCOUNTER — Ambulatory Visit (HOSPITAL_COMMUNITY)
Admission: RE | Admit: 2020-07-15 | Discharge: 2020-07-15 | Disposition: A | Discharge status: Home / Self Care | Payer: Medicare HMO | Source: Ambulatory Visit | Attending: Nurse Practitioner | Admitting: Nurse Practitioner

## 2020-07-15 ENCOUNTER — Ambulatory Visit (HOSPITAL_COMMUNITY): Payer: Medicare HMO

## 2020-07-15 ENCOUNTER — Other Ambulatory Visit: Payer: Self-pay

## 2020-07-15 ENCOUNTER — Other Ambulatory Visit (HOSPITAL_COMMUNITY): Payer: Medicare HMO

## 2020-07-15 DIAGNOSIS — Z1231 Encounter for screening mammogram for malignant neoplasm of breast: Secondary | ICD-10-CM | POA: Diagnosis not present

## 2020-07-15 DIAGNOSIS — Z780 Asymptomatic menopausal state: Secondary | ICD-10-CM | POA: Insufficient documentation

## 2020-07-15 DIAGNOSIS — M810 Age-related osteoporosis without current pathological fracture: Secondary | ICD-10-CM | POA: Diagnosis not present

## 2020-07-15 DIAGNOSIS — R29890 Loss of height: Secondary | ICD-10-CM | POA: Diagnosis not present

## 2020-07-15 DIAGNOSIS — Z8739 Personal history of other diseases of the musculoskeletal system and connective tissue: Secondary | ICD-10-CM | POA: Diagnosis not present

## 2020-07-26 ENCOUNTER — Ambulatory Visit (INDEPENDENT_AMBULATORY_CARE_PROVIDER_SITE_OTHER): Payer: Medicare HMO | Admitting: Nurse Practitioner

## 2020-07-26 ENCOUNTER — Other Ambulatory Visit: Payer: Self-pay

## 2020-07-26 ENCOUNTER — Encounter: Payer: Self-pay | Admitting: Nurse Practitioner

## 2020-07-26 VITALS — BP 130/86 | HR 98 | Temp 96.9°F | Wt 172.0 lb

## 2020-07-26 DIAGNOSIS — M816 Localized osteoporosis [Lequesne]: Secondary | ICD-10-CM | POA: Diagnosis not present

## 2020-07-26 DIAGNOSIS — Z1211 Encounter for screening for malignant neoplasm of colon: Secondary | ICD-10-CM

## 2020-07-26 DIAGNOSIS — F419 Anxiety disorder, unspecified: Secondary | ICD-10-CM | POA: Diagnosis not present

## 2020-07-26 DIAGNOSIS — Z79899 Other long term (current) drug therapy: Secondary | ICD-10-CM | POA: Diagnosis not present

## 2020-07-26 DIAGNOSIS — R69 Illness, unspecified: Secondary | ICD-10-CM | POA: Diagnosis not present

## 2020-07-26 DIAGNOSIS — E785 Hyperlipidemia, unspecified: Secondary | ICD-10-CM | POA: Diagnosis not present

## 2020-07-26 MED ORDER — ALENDRONATE SODIUM 70 MG PO TABS: 70.0000 mg | ORAL_TABLET | ORAL | 11 refills | Status: DC

## 2020-07-26 NOTE — Progress Notes (Signed)
Subjective:    Subjective   Patient ID: Katrina Dean, female    DOB: 1951-04-29, 69 y.o.   MRN: [id number]  HPI Pt here for medication follow up. 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 Patient states her anxiety is much better after re-starting Klonopin. Takes on an occasional basis. She states she has had not further issues with bad dreams or increased anxiety since stopping the Lexapro. She reports her foot pain has improved since changing Crestor to 1/2 tab Monday, Wednesday ,Friday. Pt doing well with no questions or concerns.    Review of Systems Review of Systems  Constitutional: Negative for chills, fever and weight loss.  Respiratory: Negative for cough and shortness of breath.   Cardiovascular: Negative for chest pain and palpitations.  Gastrointestinal: Negative for diarrhea, nausea and vomiting.  Musculoskeletal: Negative for joint pain.  Neurological: Negative for dizziness, weakness and headaches.  Psychiatric/Behavioral: Negative for depression and suicidal ideas. The patient is nervous/anxious and has insomnia.     GAD 7 : Generalized Anxiety Score 07/26/2020 02/16/2020 02/16/2020  Nervous, Anxious, on Edge 1 2 2   Control/stop worrying 2 3 3   Worry too much - different things 2 3 3   Trouble relaxing 2 2 2   Restless 2 2 2   Easily annoyed or irritable 2 3 3   Afraid - awful might happen 0 0 0  Total GAD 7 Score 11 15 15   Anxiety Difficulty Somewhat difficult Somewhat difficult Somewhat difficult       Objective:   Objective  BP 130/86   Pulse 98   Temp (!) 96.9 F (36.1 C)   Wt 172 lb (78 kg)   SpO2 93%   BMI 31.46 kg/m      Physical Exam Constitutional:      General: She is not in acute distress.    Appearance: She is not ill-appearing.  Cardiovascular:     Rate and Rhythm: Normal rate and regular rhythm.      Pulses: Normal pulses.     Heart sounds: Normal heart sounds. No murmur heard.   Pulmonary:     Effort: Pulmonary effort is normal. No respiratory distress.     Breath sounds: No wheezing.  Chest:     Chest wall: No tenderness.  Musculoskeletal:        General: No swelling or tenderness.     Right lower leg: No edema.     Left lower leg: No edema.  Skin:    General: Skin is dry.  Neurological:     General: No focal deficit present.     Mental Status: She is alert and oriented to person, place, and time.  Psychiatric:        Mood and Affect: Mood and affect normal. Mood is not anxious.        Behavior: Behavior normal.        Thought Content: Thought content normal.        Judgment: Judgment normal.          Assessment & Plan:   Problem List Items Addressed This Visit      Other   Hyperlipemia - Primary   Relevant Orders   Lipid Profile   Hepatic function panel   Anxiety    Other Visit Diagnoses    High risk medication use       Relevant Orders   Lipid Profile  Hepatic function panel   Screening for colon cancer       Relevant Orders   IFOBT POC (occult bld, rslt in office)    Discussed promotion of self care with patient. IFOBT provided this visit for colon cancer screening per patient request. Encouraged patient to schedule physical as soon as possible. Educated patient on use of Fosamax for osteopenia. Has taken this with minimal difficulty in the past. DC med and contact office if any stomach upset.  Patient to schedule mammogram and receive PNA vaccine at next physical. Defers daily medication for anxiety and stress at this time.  Labs pending.   Return in about 3 months (around 10/26/2020) for Physical.

## 2020-07-26 NOTE — Progress Notes (Signed)
° °  Subjective:    Patient ID: Katrina Dean, female    DOB: 16-Aug-1951, 69 y.o.   MRN: [id number]  HPI Pt here for follow up. Pt doing well. No questions or concerns.    Review of Systems     Objective:   Physical Exam        Assessment & Plan:

## 2020-07-31 ENCOUNTER — Other Ambulatory Visit: Payer: Self-pay | Admitting: Nurse Practitioner

## 2020-08-07 ENCOUNTER — Encounter: Payer: Self-pay | Admitting: Family Medicine

## 2020-08-07 ENCOUNTER — Other Ambulatory Visit: Payer: Self-pay

## 2020-08-07 ENCOUNTER — Ambulatory Visit (INDEPENDENT_AMBULATORY_CARE_PROVIDER_SITE_OTHER): Payer: Medicare HMO | Admitting: Family Medicine

## 2020-08-07 VITALS — BP 144/88 | HR 97 | Temp 97.6°F | Ht 62.0 in | Wt 171.8 lb

## 2020-08-07 DIAGNOSIS — B9689 Other specified bacterial agents as the cause of diseases classified elsewhere: Secondary | ICD-10-CM | POA: Diagnosis not present

## 2020-08-07 DIAGNOSIS — L089 Local infection of the skin and subcutaneous tissue, unspecified: Secondary | ICD-10-CM | POA: Diagnosis not present

## 2020-08-07 MED ORDER — DOXYCYCLINE HYCLATE 100 MG PO TABS: 100.0000 mg | ORAL_TABLET | Freq: Two times a day (BID) | ORAL | 0 refills | Status: DC

## 2020-08-07 NOTE — Progress Notes (Signed)
Patient ID: Katrina Dean, female    DOB: March 11, 1951, 69 y.o.   MRN: [id number]   Chief Complaint  Patient presents with  . Rash   Subjective:  CC: rash in buttock   Presents today with a complaint of "rash ". Reported that the area at the top of her" buttock crack" was itching on Monday, she had her husband look at it and it was a red area at the inside of her butt crack. Treatments include hydrogen peroxide and antibiotic ointment. She does report that she sits outside on her porch in her underwear and is possible that she got a spider bite. She does not recall a bite. She denies finding a tick. Pertinent negatives include no fever, no chills, no headache, no urinary symptoms, no constipation. rash on buttock. Came up 2 days ago. Was itchy the first day. Tried peroxide and neosporin.   Pt states she got a letter from her insurance stating she needs a urine test to check her kidneys.    Medical History [place] has a past medical history of Moderate persistent asthma, uncomplicated (12/07/7423), Osteopenia (2010), and Seasonal allergies.   Outpatient Encounter Medications as of 08/07/2020  Medication Sig  . alendronate (FOSAMAX) 70 MG tablet Take 1 tablet (70 mg total) by mouth every 7 (seven) days. Take with a full glass of water on an empty stomach.  . clonazePAM (KLONOPIN) 0.5 MG tablet Take 1/2-1 tab po BID prn severe anxiety  . OVER THE COUNTER MEDICATION Zyrtec  nasacort  . rosuvastatin (CRESTOR) 5 MG tablet TAKE 1 TABLET (5 MG TOTAL) BY MOUTH DAILY. FOR CHOLESTEROL  . VENTOLIN HFA 108 (90 Base) MCG/ACT inhaler TAKE 2 PUFFS BY MOUTH EVERY 6 HOURS AS NEEDED FOR WHEEZE OR SHORTNESS OF BREATH  . doxycycline (VIBRA-TABS) 100 MG tablet Take 1 tablet (100 mg total) by mouth 2 (two) times daily.   No facility-administered encounter medications on file as of 08/07/2020.     Review of Systems  Constitutional: Negative for chills and fever.  Respiratory: Negative for shortness of  breath.   Cardiovascular: Negative for chest pain.  Gastrointestinal: Negative for abdominal pain and constipation.  Genitourinary: Negative for dysuria and urgency.  Skin: Positive for rash.       Intergluteal cleft     Vitals BP (!) 144/88   Pulse 97   Temp 97.6 F (36.4 C)   Ht 5\' 2"  (1.575 m)   Wt 171 lb 12.8 oz (77.9 kg)   SpO2 96%   BMI 31.42 kg/m   Objective:   Physical Exam Vitals and nursing note reviewed.  Constitutional:      General: She is not in acute distress.    Appearance: Normal appearance. She is not ill-appearing.  Cardiovascular:     Rate and Rhythm: Normal rate and regular rhythm.     Heart sounds: Normal heart sounds.  Pulmonary:     Effort: Pulmonary effort is normal.     Breath sounds: Normal breath sounds.  Skin:    General: Skin is warm and dry.     Comments: approx 2 cm erythematous area in the intergluteal cleft.   Neurological:     Mental Status: She is alert and oriented to person, place, and time.  Psychiatric:        Mood and Affect: Mood normal.        Behavior: Behavior normal.        Thought Content: Thought content normal.  Judgment: Judgment normal.      Assessment and Plan   1. Bacterial skin infection - doxycycline (VIBRA-TABS) 100 MG tablet; Take 1 tablet (100 mg total) by mouth 2 (two) times daily.  Dispense: 20 tablet; Refill: 0   This could possibly be a spider or insect bite, will treat for localized skin infection. She will have her husband to take a picture of the area every 1 to 2 days so she can compare to see if healing is taking place, or if it is getting worse. She will let us know. I did not see bite marks, but the area is very erythematous, with some "extra skin "over it, is approximately 2 cm inside the intergluteal cleft.  Agrees with plan of care discussed today. Understands warning signs to seek further care: Fever, chills, worsening of symptoms, pus. Understands to follow-up if symptoms do not  improve. She will take photographs for comparison and let us know if healing is not taking place. Will notify patient, to recheck her blood pressure once she is at home, and to notify us if is consistently greater than 130/80.  Pecolia Ades, FNP-C 08/07/2020

## 2020-08-14 DIAGNOSIS — R69 Illness, unspecified: Secondary | ICD-10-CM | POA: Diagnosis not present

## 2020-08-14 DIAGNOSIS — Z8249 Family history of ischemic heart disease and other diseases of the circulatory system: Secondary | ICD-10-CM | POA: Diagnosis not present

## 2020-08-14 DIAGNOSIS — M810 Age-related osteoporosis without current pathological fracture: Secondary | ICD-10-CM | POA: Diagnosis not present

## 2020-08-14 DIAGNOSIS — Z20822 Contact with and (suspected) exposure to covid-19: Secondary | ICD-10-CM | POA: Diagnosis not present

## 2020-08-14 DIAGNOSIS — E669 Obesity, unspecified: Secondary | ICD-10-CM | POA: Diagnosis not present

## 2020-08-14 DIAGNOSIS — J301 Allergic rhinitis due to pollen: Secondary | ICD-10-CM | POA: Diagnosis not present

## 2020-08-14 DIAGNOSIS — Z7983 Long term (current) use of bisphosphonates: Secondary | ICD-10-CM | POA: Diagnosis not present

## 2020-08-14 DIAGNOSIS — Z809 Family history of malignant neoplasm, unspecified: Secondary | ICD-10-CM | POA: Diagnosis not present

## 2020-08-14 DIAGNOSIS — R32 Unspecified urinary incontinence: Secondary | ICD-10-CM | POA: Diagnosis not present

## 2020-08-14 DIAGNOSIS — E785 Hyperlipidemia, unspecified: Secondary | ICD-10-CM | POA: Diagnosis not present

## 2020-09-23 ENCOUNTER — Ambulatory Visit (HOSPITAL_COMMUNITY)
Admission: RE | Admit: 2020-09-23 | Discharge: 2020-09-23 | Disposition: A | Discharge status: Home / Self Care | Payer: Medicare HMO | Source: Ambulatory Visit | Attending: Nurse Practitioner | Admitting: Nurse Practitioner

## 2020-09-23 ENCOUNTER — Other Ambulatory Visit: Payer: Self-pay

## 2020-09-23 DIAGNOSIS — Z1231 Encounter for screening mammogram for malignant neoplasm of breast: Secondary | ICD-10-CM | POA: Diagnosis not present

## 2020-10-28 ENCOUNTER — Other Ambulatory Visit: Payer: Self-pay | Admitting: Family Medicine

## 2020-11-05 DIAGNOSIS — L570 Actinic keratosis: Secondary | ICD-10-CM | POA: Diagnosis not present

## 2020-11-05 DIAGNOSIS — L814 Other melanin hyperpigmentation: Secondary | ICD-10-CM | POA: Diagnosis not present

## 2020-11-12 ENCOUNTER — Other Ambulatory Visit: Payer: Self-pay | Admitting: Family Medicine

## 2020-12-03 DIAGNOSIS — H524 Presbyopia: Secondary | ICD-10-CM | POA: Diagnosis not present

## 2020-12-03 DIAGNOSIS — H353131 Nonexudative age-related macular degeneration, bilateral, early dry stage: Secondary | ICD-10-CM | POA: Diagnosis not present

## 2020-12-03 DIAGNOSIS — H2513 Age-related nuclear cataract, bilateral: Secondary | ICD-10-CM | POA: Diagnosis not present

## 2020-12-18 DIAGNOSIS — L570 Actinic keratosis: Secondary | ICD-10-CM | POA: Diagnosis not present

## 2021-02-01 ENCOUNTER — Other Ambulatory Visit: Payer: Self-pay | Admitting: Family Medicine

## 2021-02-03 NOTE — Telephone Encounter (Signed)
Sent my chart message to schedule appointment.

## 2021-02-03 NOTE — Telephone Encounter (Signed)
Needs visit and then route back

## 2021-02-07 DIAGNOSIS — J0100 Acute maxillary sinusitis, unspecified: Secondary | ICD-10-CM | POA: Diagnosis not present

## 2021-02-18 DIAGNOSIS — L03116 Cellulitis of left lower limb: Secondary | ICD-10-CM | POA: Diagnosis not present

## 2021-02-18 DIAGNOSIS — W57XXXA Bitten or stung by nonvenomous insect and other nonvenomous arthropods, initial encounter: Secondary | ICD-10-CM | POA: Diagnosis not present

## 2021-02-19 ENCOUNTER — Ambulatory Visit: Payer: Medicare HMO | Admitting: Family Medicine

## 2021-02-26 ENCOUNTER — Telehealth: Payer: Self-pay | Admitting: Family Medicine

## 2021-02-26 DIAGNOSIS — E785 Hyperlipidemia, unspecified: Secondary | ICD-10-CM

## 2021-02-26 DIAGNOSIS — Z79899 Other long term (current) drug therapy: Secondary | ICD-10-CM

## 2021-02-26 DIAGNOSIS — R5383 Other fatigue: Secondary | ICD-10-CM

## 2021-02-26 NOTE — Telephone Encounter (Signed)
CBC, CMP, Lipid and TSH. Thanks.

## 2021-02-26 NOTE — Telephone Encounter (Signed)
Patient has appointment 6/9 for physical and needing labs done

## 2021-02-27 DIAGNOSIS — L259 Unspecified contact dermatitis, unspecified cause: Secondary | ICD-10-CM | POA: Diagnosis not present

## 2021-02-27 NOTE — Telephone Encounter (Signed)
Blood work ordered in Epic. Patient notified. 

## 2021-03-10 DIAGNOSIS — E785 Hyperlipidemia, unspecified: Secondary | ICD-10-CM | POA: Diagnosis not present

## 2021-03-10 DIAGNOSIS — Z79899 Other long term (current) drug therapy: Secondary | ICD-10-CM | POA: Diagnosis not present

## 2021-03-10 DIAGNOSIS — R5383 Other fatigue: Secondary | ICD-10-CM | POA: Diagnosis not present

## 2021-03-11 LAB — CBC WITH DIFFERENTIAL/PLATELET
Basophils Absolute: 0.1 10*3/uL (ref 0.0–0.2)
Basos: 1 %
EOS (ABSOLUTE): 0.2 10*3/uL (ref 0.0–0.4)
Eos: 3 %
Hematocrit: 40.9 % (ref 34.0–46.6)
Hemoglobin: 13.7 g/dL (ref 11.1–15.9)
Immature Grans (Abs): 0 10*3/uL (ref 0.0–0.1)
Immature Granulocytes: 0 %
Lymphocytes Absolute: 2.9 10*3/uL (ref 0.7–3.1)
Lymphs: 41 %
MCH: 31.6 pg (ref 26.6–33.0)
MCHC: 33.5 g/dL (ref 31.5–35.7)
MCV: 95 fL (ref 79–97)
Monocytes Absolute: 0.5 10*3/uL (ref 0.1–0.9)
Monocytes: 7 %
Neutrophils Absolute: 3.3 10*3/uL (ref 1.4–7.0)
Neutrophils: 48 %
Platelets: 265 10*3/uL (ref 150–450)
RBC: 4.33 x10E6/uL (ref 3.77–5.28)
RDW: 11.7 % (ref 11.7–15.4)
WBC: 6.9 10*3/uL (ref 3.4–10.8)

## 2021-03-11 LAB — COMPREHENSIVE METABOLIC PANEL
ALT: 11 IU/L (ref 0–32)
AST: 15 IU/L (ref 0–40)
Albumin/Globulin Ratio: 1.7 (ref 1.2–2.2)
Albumin: 4.3 g/dL (ref 3.8–4.8)
Alkaline Phosphatase: 58 IU/L (ref 44–121)
BUN/Creatinine Ratio: 16 (ref 12–28)
BUN: 12 mg/dL (ref 8–27)
Bilirubin Total: 0.4 mg/dL (ref 0.0–1.2)
CO2: 24 mmol/L (ref 20–29)
Calcium: 9.3 mg/dL (ref 8.7–10.3)
Chloride: 102 mmol/L (ref 96–106)
Creatinine, Ser: 0.74 mg/dL (ref 0.57–1.00)
Globulin, Total: 2.6 g/dL (ref 1.5–4.5)
Glucose: 89 mg/dL (ref 65–99)
Potassium: 4.2 mmol/L (ref 3.5–5.2)
Sodium: 140 mmol/L (ref 134–144)
Total Protein: 6.9 g/dL (ref 6.0–8.5)
eGFR: 88 mL/min/{1.73_m2} (ref >59)

## 2021-03-11 LAB — LIPID PANEL
Chol/HDL Ratio: 4.1 ratio (ref 0.0–4.4)
Cholesterol, Total: 210 mg/dL — ABNORMAL HIGH (ref 100–199)
HDL: 51 mg/dL (ref >39)
LDL Chol Calc (NIH): 136 mg/dL — ABNORMAL HIGH (ref 0–99)
Triglycerides: 128 mg/dL (ref 0–149)
VLDL Cholesterol Cal: 23 mg/dL (ref 5–40)

## 2021-03-11 LAB — TSH: TSH: 1.32 u[IU]/mL (ref 0.450–4.500)

## 2021-03-11 NOTE — Telephone Encounter (Signed)
Patient has an appt for a physical with Hoyle Sauer this week.

## 2021-03-13 ENCOUNTER — Ambulatory Visit (INDEPENDENT_AMBULATORY_CARE_PROVIDER_SITE_OTHER): Payer: Medicare HMO | Admitting: Nurse Practitioner

## 2021-03-13 ENCOUNTER — Encounter: Payer: Self-pay | Admitting: Nurse Practitioner

## 2021-03-13 ENCOUNTER — Other Ambulatory Visit: Payer: Self-pay

## 2021-03-13 VITALS — BP 140/88 | HR 86 | Temp 97.5°F | Ht 62.45 in | Wt 164.0 lb

## 2021-03-13 DIAGNOSIS — M816 Localized osteoporosis [Lequesne]: Secondary | ICD-10-CM

## 2021-03-13 DIAGNOSIS — E785 Hyperlipidemia, unspecified: Secondary | ICD-10-CM | POA: Diagnosis not present

## 2021-03-13 DIAGNOSIS — Z0000 Encounter for general adult medical examination without abnormal findings: Secondary | ICD-10-CM | POA: Diagnosis not present

## 2021-03-13 DIAGNOSIS — Z01419 Encounter for gynecological examination (general) (routine) without abnormal findings: Secondary | ICD-10-CM | POA: Diagnosis not present

## 2021-03-13 DIAGNOSIS — J0110 Acute frontal sinusitis, unspecified: Secondary | ICD-10-CM

## 2021-03-13 MED ORDER — DOXYCYCLINE HYCLATE 100 MG PO TABS: 100.0000 mg | ORAL_TABLET | Freq: Two times a day (BID) | ORAL | 0 refills | Status: DC

## 2021-03-13 NOTE — Patient Instructions (Signed)
Consider the following vaccines: Pneumonia  Shingles COVID  For osteoporosis, consider injectable done once every 3 months or once a year (depends on insurance coverage)  Take Rosuvastatin on Mondays, Wednesdays and Fridays

## 2021-03-13 NOTE — Progress Notes (Signed)
Subjective:    Patient ID: Katrina Dean, female    DOB: 10-May-1951, 70 y.o.   MRN: [id number]  HPI  AWV AWV- Annual Wellness Visit  The patient was seen for their annual wellness visit. The patient's past medical history, surgical history, and family history were reviewed. Pertinent vaccines were reviewed ( tetanus, pneumonia, shingles, flu) The patient's medication list was reviewed and updated.  The height and weight were entered.  BMI recorded in electronic record elsewhere  Cognitive screening was completed. Outcome of Mini - Cog:5   Falls /depression screening electronically recorded within record elsewhere  Current tobacco usage: 5 cigarettes per day (All patients who use tobacco were given written and verbal information on quitting)  Recent listing of emergency department/hospitalizations over the past year were reviewed.  current specialist the patient sees on a regular basis:none   Medicare annual wellness visit patient questionnaire was reviewed.  A written screening schedule for the patient for the next 5-10 years was given. Appropriate discussion of followup regarding next visit was discussed. 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  Works outside.  Smokes a quarter to half pack cigarettes per day.  7 years total smoking history.  Does not take rosuvastatin on a regular basis, about a couple of times per week.  Has had a complete hysterectomy.  No sexual partner for the past 13 years.  Regular vision exams.  Is due for her dental exam.  Taking daily vitamin D.  Stopped her Fosamax due to stomach upset, defers any further medication for osteoporosis at this time.  Has had some sinus symptoms and cough for the past 4 days.  Began when she had some bales of hay in her car.  Since then noticed facial pressure especially along the frontal sinus area.  No ear pain or sore throat.  No fever.  Cough producing white mucus at times.  Cough is worse when she  is outside or laying down.  Has used her albuterol inhaler about 3 times total.  Otherwise rarely uses her inhaler. Depression screen [facility] 2/9 03/13/2021 02/16/2020 05/03/2017 12/26/2015  Decreased Interest 0 0 1 0  Down, Depressed, Hopeless 0 1 1 0  PHQ - 2 Score 0 1 2 0  Altered sleeping - 1 0 -  Tired, decreased energy - 1 1 -  Change in appetite - 1 - -  Feeling bad or failure about yourself  - 1 0 -  Trouble concentrating - 0 0 -  Moving slowly or fidgety/restless - 0 0 -  Suicidal thoughts - 0 0 -  PHQ-9 Score - 5 3 -  Difficult doing work/chores - Somewhat difficult - -       Review of Systems  Constitutional:  Negative for activity change and appetite change.  HENT:  Positive for congestion, postnasal drip and sinus pressure.   Respiratory:  Positive for cough and wheezing. Negative for chest tightness and shortness of breath.   Cardiovascular:  Negative for chest pain and leg swelling.       No orthopnea.  Gastrointestinal:  Negative for abdominal distention, abdominal pain, blood in stool, constipation, diarrhea, nausea and vomiting.  Genitourinary:  Negative for difficulty urinating, dysuria, enuresis, frequency, genital sores, pelvic pain, urgency and vaginal discharge.       Minimal stress incontinence.      Objective:   Physical Exam Constitutional:      General: She is not in acute distress.    Appearance: She is well-developed.  HENT:     Ears:     Comments: TMs retracted, no erythema.  Positive left frontal sinus tenderness to percussion.  Nasal mucosa clear.  Pharynx injected with green PND noted.  Neck supple with mild soft anterior adenopathy.  Allergic shiners noted. Neck:     Thyroid: No thyromegaly.     Trachea: No tracheal deviation.     Comments: Thyroid non tender to palpation. No mass or goiter noted.  Cardiovascular:     Rate and Rhythm: Normal rate and regular rhythm.     Heart sounds: Normal heart sounds. No murmur heard.   No gallop.  Pulmonary:      Effort: Pulmonary effort is normal.     Breath sounds: Normal breath sounds.  Chest:  Breasts:    Right: No swelling, inverted nipple, mass, skin change, tenderness, axillary adenopathy or supraclavicular adenopathy.     Left: No swelling, inverted nipple, mass, skin change, tenderness, axillary adenopathy or supraclavicular adenopathy.  Abdominal:     General: There is no distension.     Palpations: Abdomen is soft.     Tenderness: There is no abdominal tenderness.  Genitourinary:    Vagina: Normal.     Comments: External GU no rashes or lesions.  Pale atrophic changes noted.  Vagina pale and dry, no discharge.  Bimanual exam no tenderness or obvious masses. Musculoskeletal:     Cervical back: Normal range of motion and neck supple.  Lymphadenopathy:     Cervical: Cervical adenopathy present.     Upper Body:     Right upper body: No supraclavicular, axillary or pectoral adenopathy.     Left upper body: No supraclavicular, axillary or pectoral adenopathy.  Skin:    General: Skin is warm and dry.  Neurological:     Mental Status: She is alert and oriented to person, place, and time.  Psychiatric:        Mood and Affect: Mood normal.        Behavior: Behavior normal.        Thought Content: Thought content normal.        Judgment: Judgment normal.    03/13/21 0827  BP: 140/88  Pulse: 86  Temp: (!) 97.5 F (36.4 C)  SpO2: 95%  Weight: 164 lb (74.4 kg)  Height: 5' 2.45" (1.586 m)   Body mass index is 29.57 kg/m. Results for orders placed or performed in visit on 02/26/21  CBC with Differential/Platelet  Result Value Ref Range   WBC 6.9 3.4 - 10.8 x10E3/uL   RBC 4.33 3.77 - 5.28 x10E6/uL   Hemoglobin 13.7 11.1 - 15.9 g/dL   Hematocrit 40.9 34.0 - 46.6 %   MCV 95 79 - 97 fL   MCH 31.6 26.6 - 33.0 pg   MCHC 33.5 31.5 - 35.7 g/dL   RDW 11.7 11.7 - 15.4 %   Platelets 265 150 - 450 x10E3/uL   Neutrophils 48 Not Estab. %   Lymphs 41 Not Estab. %   Monocytes 7 Not  Estab. %   Eos 3 Not Estab. %   Basos 1 Not Estab. %   Neutrophils Absolute 3.3 1.4 - 7.0 x10E3/uL   Lymphocytes Absolute 2.9 0.7 - 3.1 x10E3/uL   Monocytes Absolute 0.5 0.1 - 0.9 x10E3/uL   EOS (ABSOLUTE) 0.2 0.0 - 0.4 x10E3/uL   Basophils Absolute 0.1 0.0 - 0.2 x10E3/uL   Immature Granulocytes 0 Not Estab. %   Immature Grans (Abs) 0.0 0.0 - 0.1 x10E3/uL  Comprehensive metabolic panel  Result Value Ref Range   Glucose 89 65 - 99 mg/dL   BUN 12 8 - 27 mg/dL   Creatinine, Ser 0.74 0.57 - 1.00 mg/dL   eGFR 88 >59 mL/min/1.73   BUN/Creatinine Ratio 16 12 - 28   Sodium 140 134 - 144 mmol/L   Potassium 4.2 3.5 - 5.2 mmol/L   Chloride 102 96 - 106 mmol/L   CO2 24 20 - 29 mmol/L   Calcium 9.3 8.7 - 10.3 mg/dL   Total Protein 6.9 6.0 - 8.5 g/dL   Albumin 4.3 3.8 - 4.8 g/dL   Globulin, Total 2.6 1.5 - 4.5 g/dL   Albumin/Globulin Ratio 1.7 1.2 - 2.2   Bilirubin Total 0.4 0.0 - 1.2 mg/dL   Alkaline Phosphatase 58 44 - 121 IU/L   AST 15 0 - 40 IU/L   ALT 11 0 - 32 IU/L  Lipid panel  Result Value Ref Range   Cholesterol, Total 210 (H) 100 - 199 mg/dL   Triglycerides 128 0 - 149 mg/dL   HDL 51 >39 mg/dL   VLDL Cholesterol Cal 23 5 - 40 mg/dL   LDL Chol Calc (NIH) 136 (H) 0 - 99 mg/dL   Chol/HDL Ratio 4.1 0.0 - 4.4 ratio  TSH  Result Value Ref Range   TSH 1.320 0.450 - 4.500 uIU/mL   Reviewed labs with patient during visit.        Assessment & Plan:   Problem List Items Addressed This Visit       Musculoskeletal and Integument   Osteoporosis     Other   Hyperlipemia   Other Visit Diagnoses     Well woman exam    -  Primary   Acute non-recurrent frontal sinusitis       Relevant Medications   doxycycline (VIBRA-TABS) 100 MG tablet      Meds ordered this encounter  Medications   doxycycline (VIBRA-TABS) 100 MG tablet    Sig: Take 1 tablet (100 mg total) by mouth 2 (two) times daily.    Dispense:  20 tablet    Refill:  0    Order Specific Question:   Supervising  Provider    Answer:   Sallee Lange A [[id number]]   OTC meds as directed for congestion and cough.  Call back if symptoms worsen or persist. LDL cholesterol is greatly improved from previous labs but still not at goal.  Recommend she take her rosuvastatin on Monday Wednesday Friday, patient agrees with this plan. To first further treatment for osteoporosis at this time. Consider the following vaccines: Pneumonia  Shingles COVID Return in about 6 months (around 09/12/2021).

## 2021-03-14 ENCOUNTER — Encounter: Payer: Self-pay | Admitting: Nurse Practitioner

## 2021-04-03 ENCOUNTER — Other Ambulatory Visit: Payer: Self-pay | Admitting: Family Medicine

## 2021-04-18 ENCOUNTER — Other Ambulatory Visit: Payer: Self-pay | Admitting: Family Medicine

## 2021-04-21 DIAGNOSIS — B349 Viral infection, unspecified: Secondary | ICD-10-CM | POA: Diagnosis not present

## 2021-04-21 DIAGNOSIS — Z20822 Contact with and (suspected) exposure to covid-19: Secondary | ICD-10-CM | POA: Diagnosis not present

## 2021-06-25 DIAGNOSIS — B029 Zoster without complications: Secondary | ICD-10-CM | POA: Diagnosis not present

## 2021-06-25 DIAGNOSIS — L309 Dermatitis, unspecified: Secondary | ICD-10-CM | POA: Diagnosis not present

## 2021-07-17 DIAGNOSIS — M810 Age-related osteoporosis without current pathological fracture: Secondary | ICD-10-CM | POA: Diagnosis not present

## 2021-07-17 DIAGNOSIS — R32 Unspecified urinary incontinence: Secondary | ICD-10-CM | POA: Diagnosis not present

## 2021-07-17 DIAGNOSIS — Z008 Encounter for other general examination: Secondary | ICD-10-CM | POA: Diagnosis not present

## 2021-07-17 DIAGNOSIS — Z882 Allergy status to sulfonamides status: Secondary | ICD-10-CM | POA: Diagnosis not present

## 2021-07-17 DIAGNOSIS — R030 Elevated blood-pressure reading, without diagnosis of hypertension: Secondary | ICD-10-CM | POA: Diagnosis not present

## 2021-07-17 DIAGNOSIS — Z880 Allergy status to penicillin: Secondary | ICD-10-CM | POA: Diagnosis not present

## 2021-07-17 DIAGNOSIS — F17200 Nicotine dependence, unspecified, uncomplicated: Secondary | ICD-10-CM | POA: Diagnosis not present

## 2021-07-17 DIAGNOSIS — E785 Hyperlipidemia, unspecified: Secondary | ICD-10-CM | POA: Diagnosis not present

## 2021-07-17 DIAGNOSIS — R69 Illness, unspecified: Secondary | ICD-10-CM | POA: Diagnosis not present

## 2021-07-17 DIAGNOSIS — G8929 Other chronic pain: Secondary | ICD-10-CM | POA: Diagnosis not present

## 2021-07-17 DIAGNOSIS — J301 Allergic rhinitis due to pollen: Secondary | ICD-10-CM | POA: Diagnosis not present

## 2021-07-17 DIAGNOSIS — Z811 Family history of alcohol abuse and dependence: Secondary | ICD-10-CM | POA: Diagnosis not present

## 2021-07-18 ENCOUNTER — Other Ambulatory Visit: Payer: Self-pay | Admitting: Family Medicine

## 2021-08-09 DIAGNOSIS — Z20822 Contact with and (suspected) exposure to covid-19: Secondary | ICD-10-CM | POA: Diagnosis not present

## 2021-08-09 DIAGNOSIS — J069 Acute upper respiratory infection, unspecified: Secondary | ICD-10-CM | POA: Diagnosis not present

## 2021-08-09 DIAGNOSIS — M791 Myalgia, unspecified site: Secondary | ICD-10-CM | POA: Diagnosis not present

## 2021-09-17 ENCOUNTER — Other Ambulatory Visit (HOSPITAL_COMMUNITY): Payer: Self-pay | Admitting: Family Medicine

## 2021-09-17 DIAGNOSIS — Z1231 Encounter for screening mammogram for malignant neoplasm of breast: Secondary | ICD-10-CM

## 2021-09-24 DIAGNOSIS — R070 Pain in throat: Secondary | ICD-10-CM | POA: Diagnosis not present

## 2021-09-24 DIAGNOSIS — J069 Acute upper respiratory infection, unspecified: Secondary | ICD-10-CM | POA: Diagnosis not present

## 2021-09-24 DIAGNOSIS — M791 Myalgia, unspecified site: Secondary | ICD-10-CM | POA: Diagnosis not present

## 2021-09-25 ENCOUNTER — Inpatient Hospital Stay (HOSPITAL_COMMUNITY): Admission: RE | Admit: 2021-09-25 | Payer: Medicare HMO | Source: Ambulatory Visit

## 2021-09-25 DIAGNOSIS — Z1231 Encounter for screening mammogram for malignant neoplasm of breast: Secondary | ICD-10-CM

## 2021-10-08 ENCOUNTER — Other Ambulatory Visit: Payer: Self-pay

## 2021-10-08 ENCOUNTER — Ambulatory Visit (HOSPITAL_COMMUNITY)
Admission: RE | Admit: 2021-10-08 | Discharge: 2021-10-08 | Disposition: A | Discharge status: Home / Self Care | Payer: Medicare HMO | Source: Ambulatory Visit | Attending: Family Medicine | Admitting: Family Medicine

## 2021-10-08 DIAGNOSIS — Z1231 Encounter for screening mammogram for malignant neoplasm of breast: Secondary | ICD-10-CM | POA: Diagnosis not present

## 2021-11-11 ENCOUNTER — Telehealth: Payer: Self-pay | Admitting: Family Medicine

## 2021-11-11 DIAGNOSIS — E785 Hyperlipidemia, unspecified: Secondary | ICD-10-CM

## 2021-11-11 DIAGNOSIS — R5383 Other fatigue: Secondary | ICD-10-CM

## 2021-11-11 DIAGNOSIS — Z79899 Other long term (current) drug therapy: Secondary | ICD-10-CM

## 2021-11-11 NOTE — Telephone Encounter (Signed)
Pt has physical scheduled for 03/27/22 at 8:20 am with Hoyle Sauer. Please send in lab orders.

## 2021-11-11 NOTE — Telephone Encounter (Signed)
Last labs 03/2021: TSH, Lipid, CMP, CBC

## 2021-11-14 NOTE — Telephone Encounter (Signed)
Blood work ordered in Epic. Patient notified. 

## 2021-11-14 NOTE — Telephone Encounter (Signed)
Nilda Simmer, NP    Please repeat same labs. Thanks!

## 2021-12-04 DIAGNOSIS — H2513 Age-related nuclear cataract, bilateral: Secondary | ICD-10-CM | POA: Diagnosis not present

## 2021-12-04 DIAGNOSIS — H524 Presbyopia: Secondary | ICD-10-CM | POA: Diagnosis not present

## 2021-12-17 DIAGNOSIS — L570 Actinic keratosis: Secondary | ICD-10-CM | POA: Diagnosis not present

## 2022-01-01 ENCOUNTER — Ambulatory Visit (INDEPENDENT_AMBULATORY_CARE_PROVIDER_SITE_OTHER): Payer: Medicare HMO | Admitting: Family Medicine

## 2022-01-01 VITALS — BP 148/84 | HR 82 | Temp 98.6°F | Wt 166.8 lb

## 2022-01-01 DIAGNOSIS — J4541 Moderate persistent asthma with (acute) exacerbation: Secondary | ICD-10-CM | POA: Insufficient documentation

## 2022-01-01 DIAGNOSIS — J45901 Unspecified asthma with (acute) exacerbation: Secondary | ICD-10-CM | POA: Insufficient documentation

## 2022-01-01 MED ORDER — PREDNISONE 10 MG PO TABS: 10.0000 mg | ORAL_TABLET | Freq: Two times a day (BID) | ORAL | 0 refills | Status: AC

## 2022-01-01 NOTE — Patient Instructions (Signed)
Albuterol every 6 hours while awake for the next 2 days. ? ?Steroids as prescribed. ? ?Call with concerns. ? ?Take care ? ?Dr. Lacinda Axon  ?

## 2022-01-01 NOTE — Progress Notes (Signed)
? ?Subjective:  ?Patient ID: Katrina Dean, female    DOB: 1951/08/03  Age: 71 y.o. MRN: [id number] ? ?CC: ?Chief Complaint  ?Patient presents with  ? Nasal Congestion  ?  Patient been sick x 4 days  ? Wheezing  ? Cough  ? ? ?HPI: ? ?71 year old female presents for evaluation of the above. ? ?Patient reports that she has had symptoms for the past 4 days.  She states that she believes that this is secondary to allergies.  Started after she recently mowed the grass.  She has underlying asthma.  She reports cough, wheezing, postnasal drip, and runny nose.  She states that her oxygen saturation has been 93 to 94% at home.  No fever.  She has been using her albuterol inhaler with improvement.  She has also been using Nasacort and Zyrtec. ? ?Patient Active Problem List  ? Diagnosis Date Noted  ? Exacerbation of asthma 01/01/2022  ? Moderate persistent asthma with exacerbation 01/01/2022  ? Anxiety 02/16/2020  ? Moderate persistent asthma, uncomplicated 10/13/3233  ? Chronic rhinitis 06/07/2018  ? Osteoporosis 05/24/2016  ? Allergic rhinitis 05/13/2016  ? Lactose intolerance 12/26/2015  ? GERD (gastroesophageal reflux disease) 12/26/2015  ? Hyperlipemia 02/20/2013  ? ? ?Social Hx   ?Social History  ? ?Socioeconomic History  ? Marital status: Legally Separated  ?  Spouse name: Not on file  ? Number of children: Not on file  ? Years of education: Not on file  ? Highest education level: Not on file  ?Occupational History  ? Occupation: Retired  ?Tobacco Use  ? Smoking status: Former  ?  Packs/day: 0.50  ?  Years: 5.00  ?  Pack years: 2.50  ?  Types: Cigarettes  ? Smokeless tobacco: Never  ?Substance and Sexual Activity  ? Alcohol use: No  ? Drug use: No  ? Sexual activity: Yes  ?  Birth control/protection: Surgical  ?Other Topics Concern  ? Not on file  ?Social History Narrative  ? Raises chickens and loves being outdoors  ? Live alone  ? ?Social Determinants of Health  ? ?Financial Resource Strain: Not on file  ?Food  Insecurity: Not on file  ?Transportation Needs: Not on file  ?Physical Activity: Not on file  ?Stress: Not on file  ?Social Connections: Not on file  ? ? ?Review of Systems ?Per HPI ? ?Objective:  ?BP (!) 148/84   Pulse 82   Temp 98.6 ?F (37 ?C) (Oral)   Wt 166 lb 12.8 oz (75.7 kg)   SpO2 92%   BMI 30.07 kg/m?  ? ? ?  01/01/2022  ?  2:50 PM 03/13/2021  ?  8:27 AM 08/07/2020  ? 10:18 AM  ?BP/Weight  ?Systolic BP 573 220 254  ?Diastolic BP 84 88 88  ?Wt. (Lbs) 166.8 164 171.8  ?BMI 30.07 kg/m2 29.57 kg/m2 31.42 kg/m2  ? ? ?Physical Exam ?Constitutional:   ?   General: She is not in acute distress. ?   Appearance: Normal appearance.  ?HENT:  ?   Head: Normocephalic and atraumatic.  ?   Right Ear: Tympanic membrane normal.  ?   Left Ear: Tympanic membrane normal.  ?   Mouth/Throat:  ?   Pharynx: Oropharynx is clear.  ?Eyes:  ?   General:     ?   Right eye: No discharge.     ?   Left eye: No discharge.  ?   Conjunctiva/sclera: Conjunctivae normal.  ?Cardiovascular:  ?   Rate and Rhythm:  Normal rate and regular rhythm.  ?Pulmonary:  ?   Effort: Pulmonary effort is normal.  ?   Comments: Diffuse wheezing. ?Neurological:  ?   Mental Status: She is alert.  ? ? ?Lab Results  ?Component Value Date  ? WBC 6.9 03/10/2021  ? HGB 13.7 03/10/2021  ? HCT 40.9 03/10/2021  ? PLT 265 03/10/2021  ? GLUCOSE 89 03/10/2021  ? CHOL 210 (H) 03/10/2021  ? TRIG 128 03/10/2021  ? HDL 51 03/10/2021  ? LDLCALC 136 (H) 03/10/2021  ? ALT 11 03/10/2021  ? AST 15 03/10/2021  ? NA 140 03/10/2021  ? K 4.2 03/10/2021  ? CL 102 03/10/2021  ? CREATININE 0.74 03/10/2021  ? BUN 12 03/10/2021  ? CO2 24 03/10/2021  ? TSH 1.320 03/10/2021  ? ? ? ?Assessment & Plan:  ? ?Problem List Items Addressed This Visit   ? ?  ? Respiratory  ? Moderate persistent asthma with exacerbation - Primary  ?  Patient experiencing asthma exacerbation.  Treating with prednisone (patient states that she can tolerate low-dose that is given twice daily).  Advised albuterol use over  the next 2 days.  Continue home Nasacort and Zyrtec. ?  ?  ? Relevant Medications  ? predniSONE (DELTASONE) 10 MG tablet  ? ? ?Meds ordered this encounter  ?Medications  ? predniSONE (DELTASONE) 10 MG tablet  ?  Sig: Take 1 tablet (10 mg total) by mouth in the morning and at bedtime for 5 days.  ?  Dispense:  10 tablet  ?  Refill:  0  ? ? ?Thersa Salt DO ?[place] ? ?

## 2022-01-01 NOTE — Assessment & Plan Note (Signed)
Patient experiencing asthma exacerbation.  Treating with prednisone (patient states that she can tolerate low-dose that is given twice daily).  Advised albuterol use over the next 2 days.  Continue home Nasacort and Zyrtec. ?

## 2022-01-04 ENCOUNTER — Other Ambulatory Visit: Payer: Self-pay | Admitting: Family Medicine

## 2022-01-20 ENCOUNTER — Other Ambulatory Visit: Payer: Self-pay | Admitting: Family Medicine

## 2022-01-20 DIAGNOSIS — R062 Wheezing: Secondary | ICD-10-CM | POA: Diagnosis not present

## 2022-01-23 ENCOUNTER — Ambulatory Visit (INDEPENDENT_AMBULATORY_CARE_PROVIDER_SITE_OTHER): Payer: Medicare HMO | Admitting: Family Medicine

## 2022-01-23 VITALS — BP 137/89 | HR 80 | Temp 98.4°F | Ht 62.45 in | Wt 168.4 lb

## 2022-01-23 DIAGNOSIS — E559 Vitamin D deficiency, unspecified: Secondary | ICD-10-CM

## 2022-01-23 DIAGNOSIS — E785 Hyperlipidemia, unspecified: Secondary | ICD-10-CM

## 2022-01-23 DIAGNOSIS — Z79899 Other long term (current) drug therapy: Secondary | ICD-10-CM

## 2022-01-23 DIAGNOSIS — Z1211 Encounter for screening for malignant neoplasm of colon: Secondary | ICD-10-CM | POA: Diagnosis not present

## 2022-01-23 DIAGNOSIS — R5383 Other fatigue: Secondary | ICD-10-CM

## 2022-01-23 DIAGNOSIS — J3089 Other allergic rhinitis: Secondary | ICD-10-CM

## 2022-01-23 MED ORDER — CLONAZEPAM 0.5 MG PO TABS: ORAL_TABLET | ORAL | 1 refills | Status: DC

## 2022-01-23 MED ORDER — AZELASTINE HCL 0.1 % NA SOLN: 2.0000 | Freq: Two times a day (BID) | NASAL | 12 refills | Status: DC

## 2022-01-23 MED ORDER — OLOPATADINE HCL 0.2 % OP SOLN: 1.0000 [drp] | Freq: Every evening | OPHTHALMIC | 0 refills | Status: DC | PRN
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
## 2022-02-09 ENCOUNTER — Encounter: Payer: Self-pay | Admitting: Family Medicine

## 2022-02-09 DIAGNOSIS — Z1211 Encounter for screening for malignant neoplasm of colon: Secondary | ICD-10-CM | POA: Diagnosis not present

## 2022-02-19 LAB — COLOGUARD: COLOGUARD: NEGATIVE

## 2022-03-19 DIAGNOSIS — E785 Hyperlipidemia, unspecified: Secondary | ICD-10-CM | POA: Diagnosis not present

## 2022-03-19 DIAGNOSIS — J3089 Other allergic rhinitis: Secondary | ICD-10-CM | POA: Diagnosis not present

## 2022-03-19 DIAGNOSIS — R5383 Other fatigue: Secondary | ICD-10-CM | POA: Diagnosis not present

## 2022-03-19 DIAGNOSIS — Z79899 Other long term (current) drug therapy: Secondary | ICD-10-CM | POA: Diagnosis not present

## 2022-03-19 DIAGNOSIS — E559 Vitamin D deficiency, unspecified: Secondary | ICD-10-CM | POA: Diagnosis not present

## 2022-03-20 LAB — TSH: TSH: 1.43 u[IU]/mL (ref 0.450–4.500)

## 2022-03-20 LAB — VITAMIN D 25 HYDROXY (VIT D DEFICIENCY, FRACTURES): Vit D, 25-Hydroxy: 20.6 ng/mL — ABNORMAL LOW (ref 30.0–100.0)

## 2022-03-20 LAB — CBC WITH DIFFERENTIAL/PLATELET
Basophils Absolute: 0.1 10*3/uL (ref 0.0–0.2)
Basos: 1 %
EOS (ABSOLUTE): 0.2 10*3/uL (ref 0.0–0.4)
Eos: 3 %
Hematocrit: 43 % (ref 34.0–46.6)
Hemoglobin: 14.5 g/dL (ref 11.1–15.9)
Immature Grans (Abs): 0 10*3/uL (ref 0.0–0.1)
Immature Granulocytes: 0 %
Lymphocytes Absolute: 2.2 10*3/uL (ref 0.7–3.1)
Lymphs: 38 %
MCH: 31.5 pg (ref 26.6–33.0)
MCHC: 33.7 g/dL (ref 31.5–35.7)
MCV: 94 fL (ref 79–97)
Monocytes Absolute: 0.4 10*3/uL (ref 0.1–0.9)
Monocytes: 8 %
Neutrophils Absolute: 2.8 10*3/uL (ref 1.4–7.0)
Neutrophils: 50 %
Platelets: 282 10*3/uL (ref 150–450)
RBC: 4.6 x10E6/uL (ref 3.77–5.28)
RDW: 11.7 % (ref 11.7–15.4)
WBC: 5.7 10*3/uL (ref 3.4–10.8)

## 2022-03-20 LAB — LIPID PANEL
Chol/HDL Ratio: 5.3 ratio — ABNORMAL HIGH (ref 0.0–4.4)
Cholesterol, Total: 226 mg/dL — ABNORMAL HIGH (ref 100–199)
HDL: 43 mg/dL (ref >39)
LDL Chol Calc (NIH): 148 mg/dL — ABNORMAL HIGH (ref 0–99)
Triglycerides: 195 mg/dL — ABNORMAL HIGH (ref 0–149)
VLDL Cholesterol Cal: 35 mg/dL (ref 5–40)

## 2022-03-20 LAB — COMPREHENSIVE METABOLIC PANEL
ALT: 10 IU/L (ref 0–32)
AST: 12 IU/L (ref 0–40)
Albumin/Globulin Ratio: 2.2 (ref 1.2–2.2)
Albumin: 4.6 g/dL (ref 3.8–4.8)
Alkaline Phosphatase: 58 IU/L (ref 44–121)
BUN/Creatinine Ratio: 15 (ref 12–28)
BUN: 13 mg/dL (ref 8–27)
Bilirubin Total: 0.4 mg/dL (ref 0.0–1.2)
CO2: 24 mmol/L (ref 20–29)
Calcium: 9.7 mg/dL (ref 8.7–10.3)
Chloride: 105 mmol/L (ref 96–106)
Creatinine, Ser: 0.86 mg/dL (ref 0.57–1.00)
Globulin, Total: 2.1 g/dL (ref 1.5–4.5)
Glucose: 92 mg/dL (ref 70–99)
Potassium: 4.5 mmol/L (ref 3.5–5.2)
Sodium: 143 mmol/L (ref 134–144)
Total Protein: 6.7 g/dL (ref 6.0–8.5)
eGFR: 73 mL/min/{1.73_m2} (ref >59)

## 2022-03-23 ENCOUNTER — Other Ambulatory Visit: Payer: Self-pay

## 2022-03-23 DIAGNOSIS — E559 Vitamin D deficiency, unspecified: Secondary | ICD-10-CM

## 2022-03-23 MED ORDER — VITAMIN D (ERGOCALCIFEROL) 1.25 MG (50000 UNIT) PO CAPS: 50000.0000 [IU] | ORAL_CAPSULE | ORAL | 4 refills | Status: DC

## 2022-03-24 ENCOUNTER — Ambulatory Visit (INDEPENDENT_AMBULATORY_CARE_PROVIDER_SITE_OTHER): Payer: Medicare HMO

## 2022-03-24 VITALS — Ht 62.5 in | Wt 163.0 lb

## 2022-03-24 DIAGNOSIS — Z0000 Encounter for general adult medical examination without abnormal findings: Secondary | ICD-10-CM

## 2022-03-24 NOTE — Progress Notes (Signed)
Subjective:   Katrina Dean is a 71 y.o. female who presents for Medicare Annual (Subsequent) preventive examination. Virtual Visit via Telephone Note  I connected with  Katrina Dean on 03/24/22 at  2:15 PM EDT by telephone and verified that I am speaking with the correct person using two identifiers.  Location: Patient: HOME Provider: [facility] Persons participating in the virtual visit: patient/Nurse Health Advisor   I discussed the limitations, risks, security and privacy concerns of performing an evaluation and management service by telephone and the availability of in person appointments. The patient expressed understanding and agreed to proceed.  Interactive audio and video telecommunications were attempted between this nurse and patient, however failed, due to patient having technical difficulties OR patient did not have access to video capability.  We continued and completed visit with audio only.  Some vital signs may be absent or patient reported.   Chriss Driver, LPN  Review of Systems     Cardiac Risk Factors include: advanced age (>38mn, >>38women);dyslipidemia;sedentary lifestyle     Objective:    Today's Vitals   03/24/22 1405  Weight: 163 lb (73.9 kg)  Height: 5' 2.5" (1.588 m)   Body mass index is 29.34 kg/m.     03/24/2022    2:13 PM 06/12/2018   10:47 AM  Advanced Directives  Does Patient Have a Medical Advance Directive? Yes No  Type of AParamedicof ALaurelLiving will   Copy of HPocassetin Chart? No - copy requested     Current Medications (verified) Outpatient Encounter Medications as of 03/24/2022  Medication Sig   azelastine (ASTELIN) 0.1 % nasal spray Place 2 sprays into both nostrils 2 (two) times daily.   clonazePAM (KLONOPIN) 0.5 MG tablet Take 1/2-1 tab po BID prn severe anxiety   Olopatadine HCl 0.2 % SOLN Apply 1 drop to eye at bedtime as needed.   OVER THE COUNTER MEDICATION Zyrtec   nasacort   rosuvastatin (CRESTOR) 5 MG tablet TAKE 1 TABLET BY MOUTH DAILY. FOR CHOLESTEROL   VENTOLIN HFA 108 (90 Base) MCG/ACT inhaler TAKE 2 PUFFS BY MOUTH EVERY 6 HOURS AS NEEDED FOR WHEEZE OR SHORTNESS OF BREATH   Vitamin D, Ergocalciferol, (DRISDOL) 1.25 MG (50000 UNIT) CAPS capsule Take 1 capsule (50,000 Units total) by mouth every 7 (seven) days.   No facility-administered encounter medications on file as of 03/24/2022.    Allergies (verified) Erythromycin, Penicillins, Elemental sulfur, Hydrocodone, Levaquin [levofloxacin in d5w], Prednisone, and Sulfa antibiotics   History: Past Medical History:  Diagnosis Date   Moderate persistent asthma, uncomplicated 99/04/6733  Osteopenia 2010   Seasonal allergies    Past Surgical History:  Procedure Laterality Date   ABDOMINAL HYSTERECTOMY     ADENOIDECTOMY     LAPAROSCOPIC BILATERAL SALPINGO OOPHERECTOMY     TONSILLECTOMY     TONSILLECTOMY AND ADENOIDECTOMY     Family History  Problem Relation Age of Onset   Cancer Maternal Grandfather    Diabetes Paternal Grandfather    Osteoporosis Mother    Osteoporosis Maternal Grandmother    Hypertension Paternal Grandmother    Cancer Father    Cancer Maternal Uncle    Diabetes Paternal Uncle    Allergic rhinitis Son    Angioedema Neg Hx    Asthma Neg Hx    Atopy Neg Hx    Eczema Neg Hx    Social History   Socioeconomic History   Marital status: Legally Separated  Spouse name: Not on file   Number of children: 1   Years of education: Not on file   Highest education level: Not on file  Occupational History   Occupation: Retired  Tobacco Use   Smoking status: Former    Packs/day: 0.50    Years: 5.00    Total pack years: 2.50    Types: Cigarettes   Smokeless tobacco: Never  Substance and Sexual Activity   Alcohol use: No   Drug use: No   Sexual activity: Yes    Birth control/protection: Surgical  Other Topics Concern   Not on file  Social History Narrative    Raises chickens and loves being outdoors   Live alone.   1 son, grandchildren and 1 great great grandson   Social Determinants of Health   Financial Resource Strain: Low Risk  (03/24/2022)   Overall Financial Resource Strain (CARDIA)    Difficulty of Paying Living Expenses: Not hard at all  Food Insecurity: No Food Insecurity (03/24/2022)   Hunger Vital Sign    Worried About Running Out of Food in the Last Year: Never true    Ran Out of Food in the Last Year: Never true  Transportation Needs: No Transportation Needs (03/24/2022)   PRAPARE - Hydrologist (Medical): No    Lack of Transportation (Non-Medical): No  Physical Activity: Sufficiently Active (03/24/2022)   Exercise Vital Sign    Days of Exercise per Week: 5 days    Minutes of Exercise per Session: 30 min  Stress: Stress Concern Present (03/24/2022)   [place]    Feeling of Stress : To some extent  Social Connections: Moderately Integrated (03/24/2022)   Social Connection and Isolation Panel [NHANES]    Frequency of Communication with Friends and Family: More than three times a week    Frequency of Social Gatherings with Friends and Family: More than three times a week    Attends Religious Services: More than 4 times per year    Active Member of [employer] or Organizations: Yes    Attends Music therapist: More than 4 times per year    Marital Status: Divorced    Tobacco Counseling Counseling given: Not Answered   Clinical Intake:  Pre-visit preparation completed: Yes  Pain : No/denies pain     BMI - recorded: 29.34 Nutritional Status: BMI 25 -29 Overweight Nutritional Risks: Other (Comment) Diabetes: No  How often do you need to have someone help you when you read instructions, pamphlets, or other written materials from your doctor or pharmacy?: 1 - Never  Diabetic?NO  Interpreter Needed?: No  Information  entered by :: mj Alara Daniel, lpn   Activities of Daily Living    03/24/2022    2:15 PM  In your present state of health, do you have any difficulty performing the following activities:  Hearing? 0  Vision? 0  Difficulty concentrating or making decisions? 0  Walking or climbing stairs? 0  Dressing or bathing? 0  Doing errands, shopping? 0  Preparing Food and eating ? N  Using the Toilet? N  In the past six months, have you accidently leaked urine? Y  Comment stress incontinenance.  Do you have problems with loss of bowel control? N  Managing your Medications? N  Managing your Finances? N  Housekeeping or managing your Housekeeping? N    Patient Care Team: Kathyrn Drown, MD as PCP - General (Family Medicine)  Indicate  any recent Medical Services you may have received from other than [facility] providers in the past year (date may be approximate).     Assessment:   This is a routine wellness examination for Katrina Dean.  Hearing/Vision screen Hearing Screening - Comments:: No hearing issues.   Vision Screening - Comments:: Glasses. [facility] 01/2022.  Dietary issues and exercise activities discussed: Current Exercise Habits: Home exercise routine, Type of exercise: walking, Time (Minutes): 30, Frequency (Times/Week): 5, Weekly Exercise (Minutes/Week): 150, Intensity: Mild, Exercise limited by: cardiac condition(s);orthopedic condition(s)   Goals Addressed             This Visit's Progress    Exercise 3x per week (30 min per time)       Stay active and healthy.        Depression Screen    03/24/2022    2:09 PM 03/13/2021    8:29 AM 02/16/2020   10:50 AM 05/03/2017   11:10 AM 12/26/2015    9:51 AM  PHQ 2/9 Scores  PHQ - 2 Score 0 0 1 2 0  PHQ- 9 Score   5 3     Fall Risk    03/24/2022    2:13 PM 03/13/2021    8:30 AM 02/16/2020    9:48 AM 12/26/2015    9:51 AM  Fall Risk   Falls in the past year? 0 0 0 No  Number falls in past yr: 0 0    Injury with Fall? 0 0     Risk for fall due to : No Fall Risks No Fall Risks    Follow up Falls prevention discussed Falls evaluation completed Falls evaluation completed     [place]:  Any stairs in or around the home? Yes  If so, are there any without handrails? No  Home free of loose throw rugs in walkways, pet beds, electrical cords, etc? Yes  Adequate lighting in your home to reduce risk of falls? Yes   ASSISTIVE DEVICES UTILIZED TO PREVENT FALLS:  Life alert? No  Use of a cane, walker or w/c? No  Grab bars in the bathroom? No  Shower chair or bench in shower? No  Elevated toilet seat or a handicapped toilet? Yes   TIMED UP AND GO:  Was the test performed? No .  Phone visit.  Cognitive Function:        03/24/2022    2:16 PM  6CIT Screen  What Year? 0 points  What month? 0 points  What time? 0 points  Count back from 20 0 points  Months in reverse 0 points  Repeat phrase 0 points  Total Score 0 points    Immunizations Immunization History  Administered Date(s) Administered   Moderna Sars-Covid-2 Vaccination 07/11/2020   Tdap 12/26/2015    TDAP status: Up to date  Flu Vaccine status: Declined, Education has been provided regarding the importance of this vaccine but patient still declined. Advised may receive this vaccine at local pharmacy or Health Dept. Aware to provide a copy of the vaccination record if obtained from local pharmacy or Health Dept. Verbalized acceptance and understanding.  Pneumococcal vaccine status: Due, Education has been provided regarding the importance of this vaccine. Advised may receive this vaccine at local pharmacy or Health Dept. Aware to provide a copy of the vaccination record if obtained from local pharmacy or Health Dept. Verbalized acceptance and understanding.  Covid-19 vaccine status: Information provided on how to obtain vaccines.   Qualifies  for Shingles Vaccine? Yes   Zostavax completed No   Shingrix  Completed?: No.    Education has been provided regarding the importance of this vaccine. Patient has been advised to call insurance company to determine out of pocket expense if they have not yet received this vaccine. Advised may also receive vaccine at local pharmacy or Health Dept. Verbalized acceptance and understanding.  Screening Tests Health Maintenance  Topic Date Due   COVID-19 Vaccine (2 - Moderna risk series) 04/09/2022 (Originally 08/08/2020)   Pneumonia Vaccine 70+ Years old (1 - PCV) 10/02/2022 (Originally 08/20/2017)   Zoster Vaccines- Shingrix (1 of 2) 10/02/2022 (Originally 08/21/1970)   INFLUENZA VACCINE  05/05/2022   MAMMOGRAM  10/09/2023   Fecal DNA (Cologuard)  02/09/2025   TETANUS/TDAP  12/25/2025   DEXA SCAN  Completed   Hepatitis C Screening  Completed   HPV VACCINES  Aged Out    Health Maintenance  There are no preventive care reminders to display for this patient.   Colorectal cancer screening: Type of screening: Cologuard. Completed 02/09/2022. Repeat every 3 years  Mammogram status: Completed 10/08/2021. Repeat every year  Bone Density status: Completed 07/15/2020. Results reflect: Bone density results: OSTEOPOROSIS. Repeat every 2 years.  Lung Cancer Screening: (Low Dose CT Chest recommended if Age 30-80 years, 30 pack-year currently smoking OR have quit w/in 15years.) does not qualify.  Additional Screening:  Hepatitis C Screening: does not qualify; Completed 12/26/2015  Vision Screening: Recommended annual ophthalmology exams for early detection of glaucoma and other disorders of the eye. Is the patient up to date with their annual eye exam?  Yes  Who is the provider or what is the name of the office in which the patient attends annual eye exams? [facility]. If pt is not established with a provider, would they like to be referred to a provider to establish care? No .   Dental Screening: Recommended annual dental exams for proper oral  hygiene  Community Resource Referral / Chronic Care Management: CRR required this visit?  No   CCM required this visit?  No      Plan:     I have personally reviewed and noted the following in the patient's chart:   Medical and social history Use of alcohol, tobacco or illicit drugs  Current medications and supplements including opioid prescriptions.  Functional ability and status Nutritional status Physical activity Advanced directives List of other physicians Hospitalizations, surgeries, and ER visits in previous 12 months Vitals Screenings to include cognitive, depression, and falls Referrals and appointments  In addition, I have reviewed and discussed with patient certain preventive protocols, quality metrics, and best practice recommendations. A written personalized care plan for preventive services as well as general preventive health recommendations were provided to patient.     Chriss Driver, LPN   1/61/0960   Nurse Notes: Discussed [id number], Shingrix and how to obtain.

## 2022-03-24 NOTE — Patient Instructions (Signed)
Katrina Dean , Thank you for taking time to come for your Medicare Wellness Visit. I appreciate your ongoing commitment to your health goals. Please review the following plan we discussed and let me know if I can assist you in the future.   Screening recommendations/referrals: Colonoscopy: Cologuard done 02/09/2022. Repeat every 3 years. Mammogram: Done 10/08/2021. Repeat annually  Bone Density: Done 07/15/2020 Repeat every 2 years  Recommended yearly ophthalmology/optometry visit for glaucoma screening and checkup Recommended yearly dental visit for hygiene and checkup  Vaccinations: Influenza vaccine: Due Fall 2023. Pneumococcal vaccine: Discussed. Prevnar-20 is available at [facility]. Tdap vaccine: Due every 10 years. Shingles vaccine: Discussed. Available at your local pharmacy.   Covid-19:Done 07/11/2020.  Advanced directives: Please bring a copy of your health care power of attorney and living will to the office to be added to your chart at your convenience.   Conditions/risks identified: KEEP UP THE GOOD WORK!! Aim for 30 minutes of exercise or brisk walking, 6-8 glasses of water, and 5 servings of fruits and vegetables each day.   Next appointment: Follow up in one year for your annual wellness visit 2024.   Preventive Care 20 Years and Older, Female Preventive care refers to lifestyle choices and visits with your health care provider that can promote health and wellness. What does preventive care include? A yearly physical exam. This is also called an annual well check. Dental exams once or twice a year. Routine eye exams. Ask your health care provider how often you should have your eyes checked. Personal lifestyle choices, including: Daily care of your teeth and gums. Regular physical activity. Eating a healthy diet. Avoiding tobacco and drug use. Limiting alcohol use. Practicing safe sex. Taking low-dose aspirin every day. Taking vitamin and mineral supplements as recommended  by your health care provider. What happens during an annual well check? The services and screenings done by your health care provider during your annual well check will depend on your age, overall health, lifestyle risk factors, and family history of disease. Counseling  Your health care provider may ask you questions about your: Alcohol use. Tobacco use. Drug use. Emotional well-being. Home and relationship well-being. Sexual activity. Eating habits. History of falls. Memory and ability to understand (cognition). Work and work Statistician. Reproductive health. Screening  You may have the following tests or measurements: Height, weight, and BMI. Blood pressure. Lipid and cholesterol levels. These may be checked every 5 years, or more frequently if you are over 56 years old. Skin check. Lung cancer screening. You may have this screening every year starting at age 41 if you have a 30-pack-year history of smoking and currently smoke or have quit within the past 15 years. Fecal occult blood test (FOBT) of the stool. You may have this test every year starting at age 30. Flexible sigmoidoscopy or colonoscopy. You may have a sigmoidoscopy every 5 years or a colonoscopy every 10 years starting at age 60. Hepatitis C blood test. Hepatitis B blood test. Sexually transmitted disease (STD) testing. Diabetes screening. This is done by checking your blood sugar (glucose) after you have not eaten for a while (fasting). You may have this done every 1-3 years. Bone density scan. This is done to screen for osteoporosis. You may have this done starting at age 60. Mammogram. This may be done every 1-2 years. Talk to your health care provider about how often you should have regular mammograms. Talk with your health care provider about your test results, treatment options, and if necessary,  the need for more tests. Vaccines  Your health care provider may recommend certain vaccines, such as: Influenza  vaccine. This is recommended every year. Tetanus, diphtheria, and acellular pertussis (Tdap, Td) vaccine. You may need a Td booster every 10 years. Zoster vaccine. You may need this after age 74. Pneumococcal 13-valent conjugate (PCV13) vaccine. One dose is recommended after age 69. Pneumococcal polysaccharide (PPSV23) vaccine. One dose is recommended after age 54. Talk to your health care provider about which screenings and vaccines you need and how often you need them. This information is not intended to replace advice given to you by your health care provider. Make sure you discuss any questions you have with your health care provider. Document Released: 10/18/2015 Document Revised: 06/10/2016 Document Reviewed: 07/23/2015 Elsevier Interactive Patient Education  2017 [place] Prevention in the Home Falls can cause injuries. They can happen to people of all ages. There are many things you can do to make your home safe and to help prevent falls. What can I do on the outside of my home? Regularly fix the edges of walkways and driveways and fix any cracks. Remove anything that might make you trip as you walk through a door, such as a raised step or threshold. Trim any bushes or trees on the path to your home. Use bright outdoor lighting. Clear any walking paths of anything that might make someone trip, such as rocks or tools. Regularly check to see if handrails are loose or broken. Make sure that both sides of any steps have handrails. Any raised decks and porches should have guardrails on the edges. Have any leaves, snow, or ice cleared regularly. Use sand or salt on walking paths during winter. Clean up any spills in your garage right away. This includes oil or grease spills. What can I do in the bathroom? Use night lights. Install grab bars by the toilet and in the tub and shower. Do not use towel bars as grab bars. Use non-skid mats or decals in the tub or shower. If you  need to sit down in the shower, use a plastic, non-slip stool. Keep the floor dry. Clean up any water that spills on the floor as soon as it happens. Remove soap buildup in the tub or shower regularly. Attach bath mats securely with double-sided non-slip rug tape. Do not have throw rugs and other things on the floor that can make you trip. What can I do in the bedroom? Use night lights. Make sure that you have a light by your bed that is easy to reach. Do not use any sheets or blankets that are too big for your bed. They should not hang down onto the floor. Have a firm chair that has side arms. You can use this for support while you get dressed. Do not have throw rugs and other things on the floor that can make you trip. What can I do in the kitchen? Clean up any spills right away. Avoid walking on wet floors. Keep items that you use a lot in easy-to-reach places. If you need to reach something above you, use a strong step stool that has a grab bar. Keep electrical cords out of the way. Do not use floor polish or wax that makes floors slippery. If you must use wax, use non-skid floor wax. Do not have throw rugs and other things on the floor that can make you trip. What can I do with my stairs? Do not leave any items on  the stairs. Make sure that there are handrails on both sides of the stairs and use them. Fix handrails that are broken or loose. Make sure that handrails are as long as the stairways. Check any carpeting to make sure that it is firmly attached to the stairs. Fix any carpet that is loose or worn. Avoid having throw rugs at the top or bottom of the stairs. If you do have throw rugs, attach them to the floor with carpet tape. Make sure that you have a light switch at the top of the stairs and the bottom of the stairs. If you do not have them, ask someone to add them for you. What else can I do to help prevent falls? Wear shoes that: Do not have high heels. Have rubber  bottoms. Are comfortable and fit you well. Are closed at the toe. Do not wear sandals. If you use a stepladder: Make sure that it is fully opened. Do not climb a closed stepladder. Make sure that both sides of the stepladder are locked into place. Ask someone to hold it for you, if possible. Clearly mark and make sure that you can see: Any grab bars or handrails. First and last steps. Where the edge of each step is. Use tools that help you move around (mobility aids) if they are needed. These include: Canes. Walkers. Scooters. Crutches. Turn on the lights when you go into a dark area. Replace any light bulbs as soon as they burn out. Set up your furniture so you have a clear path. Avoid moving your furniture around. If any of your floors are uneven, fix them. If there are any pets around you, be aware of where they are. Review your medicines with your doctor. Some medicines can make you feel dizzy. This can increase your chance of falling. Ask your doctor what other things that you can do to help prevent falls. This information is not intended to replace advice given to you by your health care provider. Make sure you discuss any questions you have with your health care provider. Document Released: 07/18/2009 Document Revised: 02/27/2016 Document Reviewed: 10/26/2014 Elsevier Interactive Patient Education  2017 [employer].

## 2022-03-27 ENCOUNTER — Encounter: Payer: Self-pay | Admitting: Nurse Practitioner

## 2022-03-27 ENCOUNTER — Ambulatory Visit (INDEPENDENT_AMBULATORY_CARE_PROVIDER_SITE_OTHER): Payer: Medicare HMO | Admitting: Nurse Practitioner

## 2022-03-27 VITALS — BP 128/74 | Ht 61.0 in | Wt 161.6 lb

## 2022-03-27 DIAGNOSIS — E559 Vitamin D deficiency, unspecified: Secondary | ICD-10-CM | POA: Diagnosis not present

## 2022-03-27 DIAGNOSIS — R69 Illness, unspecified: Secondary | ICD-10-CM | POA: Diagnosis not present

## 2022-03-27 DIAGNOSIS — E785 Hyperlipidemia, unspecified: Secondary | ICD-10-CM

## 2022-03-27 DIAGNOSIS — F17210 Nicotine dependence, cigarettes, uncomplicated: Secondary | ICD-10-CM

## 2022-03-27 DIAGNOSIS — F419 Anxiety disorder, unspecified: Secondary | ICD-10-CM

## 2022-03-27 DIAGNOSIS — M810 Age-related osteoporosis without current pathological fracture: Secondary | ICD-10-CM | POA: Diagnosis not present

## 2022-03-27 DIAGNOSIS — Z01419 Encounter for gynecological examination (general) (routine) without abnormal findings: Secondary | ICD-10-CM | POA: Diagnosis not present

## 2022-03-27 DIAGNOSIS — J069 Acute upper respiratory infection, unspecified: Secondary | ICD-10-CM

## 2022-07-16 ENCOUNTER — Other Ambulatory Visit: Payer: Self-pay | Admitting: Family Medicine

## 2022-07-23 ENCOUNTER — Ambulatory Visit (INDEPENDENT_AMBULATORY_CARE_PROVIDER_SITE_OTHER): Payer: Medicare HMO | Admitting: Family Medicine

## 2022-07-23 VITALS — BP 138/83 | HR 83 | Temp 98.4°F | Ht 61.0 in | Wt 164.0 lb

## 2022-07-23 DIAGNOSIS — M778 Other enthesopathies, not elsewhere classified: Secondary | ICD-10-CM | POA: Diagnosis not present

## 2022-07-23 DIAGNOSIS — J019 Acute sinusitis, unspecified: Secondary | ICD-10-CM

## 2022-07-23 MED ORDER — DOXYCYCLINE HYCLATE 100 MG PO TABS: 100.0000 mg | ORAL_TABLET | Freq: Two times a day (BID) | ORAL | 0 refills | Status: DC

## 2022-07-23 MED ORDER — TRAMADOL HCL 50 MG PO TABS: 50.0000 mg | ORAL_TABLET | Freq: Three times a day (TID) | ORAL | 0 refills | Status: AC | PRN

## 2022-07-23 MED ORDER — AZELASTINE HCL 0.1 % NA SOLN: 2.0000 | Freq: Two times a day (BID) | NASAL | 12 refills | Status: DC

## 2022-07-23 NOTE — Progress Notes (Addendum)
   Subjective:    Patient ID: Katrina Dean, female    DOB: [birth year], 71 y.o.   MRN: [id number]  HPI This verifies that the history review of any tests, physical exam, assessment and plan were conducted by Dr. Sallee Lange and documented accordingly by him today Sallee Lange MD primary care [facility]  Nasal congestion x 2 weeks now cough and chest congestion  Denies high fever chills sweats wheezing difficulty breathing  Patient states she did a lot of outside yard work and had right shoulder pain discomfort and stiffness Review of Systems     Objective:   Physical Exam Decreased range of motion right shoulder Lungs clear heart regular Sinus mild tenderness       Assessment & Plan:  Shoulder tendinitis gentle range of motion recommend orthopedic referral if not improved within 30 days  Sinusitis antibiotic as prescribed

## 2022-09-21 ENCOUNTER — Other Ambulatory Visit (HOSPITAL_COMMUNITY): Payer: Self-pay | Admitting: Family Medicine

## 2022-09-21 DIAGNOSIS — Z1231 Encounter for screening mammogram for malignant neoplasm of breast: Secondary | ICD-10-CM

## 2022-10-04 ENCOUNTER — Other Ambulatory Visit: Payer: Self-pay | Admitting: Family Medicine

## 2022-10-12 ENCOUNTER — Ambulatory Visit (HOSPITAL_COMMUNITY): Payer: Medicare HMO

## 2022-10-26 ENCOUNTER — Ambulatory Visit (HOSPITAL_COMMUNITY)
Admission: RE | Admit: 2022-10-26 | Discharge: 2022-10-26 | Disposition: A | Discharge status: Home / Self Care | Payer: Medicare HMO | Source: Ambulatory Visit | Attending: Family Medicine | Admitting: Family Medicine

## 2022-10-26 DIAGNOSIS — Z1231 Encounter for screening mammogram for malignant neoplasm of breast: Secondary | ICD-10-CM | POA: Diagnosis not present

## 2022-12-15 DIAGNOSIS — L570 Actinic keratosis: Secondary | ICD-10-CM | POA: Diagnosis not present

## 2022-12-15 DIAGNOSIS — D239 Other benign neoplasm of skin, unspecified: Secondary | ICD-10-CM | POA: Diagnosis not present

## 2022-12-15 DIAGNOSIS — D485 Neoplasm of uncertain behavior of skin: Secondary | ICD-10-CM | POA: Diagnosis not present

## 2022-12-15 DIAGNOSIS — Z1283 Encounter for screening for malignant neoplasm of skin: Secondary | ICD-10-CM | POA: Diagnosis not present

## 2023-01-01 ENCOUNTER — Other Ambulatory Visit: Payer: Self-pay | Admitting: Family Medicine

## 2023-01-15 DIAGNOSIS — H2513 Age-related nuclear cataract, bilateral: Secondary | ICD-10-CM | POA: Diagnosis not present

## 2023-01-15 DIAGNOSIS — H524 Presbyopia: Secondary | ICD-10-CM | POA: Diagnosis not present

## 2023-03-31 ENCOUNTER — Ambulatory Visit (INDEPENDENT_AMBULATORY_CARE_PROVIDER_SITE_OTHER): Payer: Medicare HMO | Admitting: Nurse Practitioner

## 2023-03-31 VITALS — BP 114/72 | HR 89 | Ht 61.0 in | Wt 162.2 lb

## 2023-03-31 DIAGNOSIS — M810 Age-related osteoporosis without current pathological fracture: Secondary | ICD-10-CM | POA: Diagnosis not present

## 2023-03-31 DIAGNOSIS — Z0000 Encounter for general adult medical examination without abnormal findings: Secondary | ICD-10-CM

## 2023-03-31 DIAGNOSIS — Z1211 Encounter for screening for malignant neoplasm of colon: Secondary | ICD-10-CM

## 2023-03-31 DIAGNOSIS — Z01419 Encounter for gynecological examination (general) (routine) without abnormal findings: Secondary | ICD-10-CM

## 2023-03-31 DIAGNOSIS — E785 Hyperlipidemia, unspecified: Secondary | ICD-10-CM

## 2023-03-31 DIAGNOSIS — Z0001 Encounter for general adult medical examination with abnormal findings: Secondary | ICD-10-CM | POA: Diagnosis not present

## 2023-03-31 DIAGNOSIS — F17210 Nicotine dependence, cigarettes, uncomplicated: Secondary | ICD-10-CM

## 2023-03-31 MED ORDER — ALENDRONATE SODIUM 70 MG PO TABS: 70.0000 mg | ORAL_TABLET | ORAL | 3 refills | Status: DC

## 2023-03-31 NOTE — Progress Notes (Unsigned)
   Subjective:    Patient ID: Katrina Dean, female    DOB: 1951-07-24, 72 y.o.   MRN: [id number]  HPI AWV- Annual Wellness Visit  The patient was seen for their annual wellness visit. The patient's past medical history, surgical history, and family history were reviewed. Pertinent vaccines were reviewed ( tetanus, pneumonia, shingles, flu) The patient's medication list was reviewed and updated.  The height and weight were entered.  BMI recorded in electronic record elsewhere  Cognitive screening was completed. Outcome of Mini - Cog: 5   Falls /depression screening electronically recorded within record elsewhere  Current tobacco usage:yes 1/2 pack daily (All patients who use tobacco were given written and verbal information on quitting)  Recent listing of emergency department/hospitalizations over the past year were reviewed.  current specialist the patient sees on a regular basis: no   Medicare annual wellness visit patient questionnaire was reviewed.  A written screening schedule for the patient for the next 5-10 years was given. Appropriate discussion of followup regarding next visit was discussed.       Review of Systems     Objective:   Physical Exam        Assessment & Plan:

## 2023-03-31 NOTE — Patient Instructions (Signed)
Consider the following vaccines: Prevnar 20 Shingles vaccine COVID  Also calculate your smoking history over time to see if you qualify for lung cancer screening. Thanks!

## 2023-04-01 ENCOUNTER — Encounter: Payer: Self-pay | Admitting: Nurse Practitioner

## 2023-04-01 ENCOUNTER — Other Ambulatory Visit: Payer: Self-pay | Admitting: Nurse Practitioner

## 2023-04-01 DIAGNOSIS — Z122 Encounter for screening for malignant neoplasm of respiratory organs: Secondary | ICD-10-CM

## 2023-04-02 ENCOUNTER — Ambulatory Visit (INDEPENDENT_AMBULATORY_CARE_PROVIDER_SITE_OTHER): Payer: Medicare HMO

## 2023-04-02 ENCOUNTER — Other Ambulatory Visit: Payer: Self-pay | Admitting: Family Medicine

## 2023-04-02 VITALS — Ht 60.0 in | Wt 162.0 lb

## 2023-04-02 DIAGNOSIS — Z0000 Encounter for general adult medical examination without abnormal findings: Secondary | ICD-10-CM

## 2023-04-02 NOTE — Progress Notes (Signed)
 Subjective:   Katrina Dean is a 72 y.o. female who presents for Medicare Annual (Subsequent) preventive examination.  Visit Complete: Virtual  I connected with  Katrina Dean on 04/02/23 by a audio enabled telemedicine application and verified that I am speaking with the correct person using two identifiers.  Patient Location: Home  Provider Location: Home Office  I discussed the limitations of evaluation and management by telemedicine. The patient expressed understanding and agreed to proceed.  Patient Medicare AWV questionnaire was completed by the patient on 04/01/23; I have confirmed that all information answered by patient is correct and no changes since this date.  Review of Systems     Cardiac Risk Factors include: advanced age (>67men, >28 women);dyslipidemia;obesity (BMI >30kg/m2);smoking/ tobacco exposure     Objective:    Today's Vitals   04/02/23 1353  Weight: 162 lb (73.5 kg)  Height: 5' (1.524 m)   Body mass index is 31.64 kg/m.     04/02/2023    1:53 PM 03/24/2022    2:13 PM 06/12/2018   10:47 AM  Advanced Directives  Does Patient Have a Medical Advance Directive? No Yes No  Type of Special educational needs teacher of [place];Living will   Copy of Healthcare Power of Attorney in Chart?  No - copy requested   Would patient like information on creating a medical advance directive? No - Patient declined      Current Medications (verified) Outpatient Encounter Medications as of 04/02/2023  Medication Sig   alendronate (FOSAMAX) 70 MG tablet Take 1 tablet (70 mg total) by mouth every 7 (seven) days. Take with a full glass of water on an empty stomach.   OVER THE COUNTER MEDICATION Zyrtec  nasacort   rosuvastatin (CRESTOR) 5 MG tablet TAKE 1 TABLET BY MOUTH EVERY DAY FOR CHOLESTEROL   clonazePAM (KLONOPIN) 0.5 MG tablet Take 1/2-1 tab po BID prn severe anxiety (Patient not taking: Reported on 04/02/2023)   VENTOLIN HFA 108 (90 Base) MCG/ACT inhaler TAKE  2 PUFFS BY MOUTH EVERY 6 HOURS AS NEEDED FOR WHEEZE OR SHORTNESS OF BREATH (Patient not taking: Reported on 04/02/2023)   No facility-administered encounter medications on file as of 04/02/2023.    Allergies (verified) Erythromycin, Penicillins, Elemental sulfur, Hydrocodone, Levaquin [levofloxacin in d5w], Prednisone, and Sulfa antibiotics   History: Past Medical History:  Diagnosis Date   Allergy Years ago   Seasonal   Cataract 3 years ago   Moderate persistent asthma, uncomplicated 06/07/2018   Osteopenia 2010   Seasonal allergies    Past Surgical History:  Procedure Laterality Date   ABDOMINAL HYSTERECTOMY     ADENOIDECTOMY     LAPAROSCOPIC BILATERAL SALPINGO OOPHERECTOMY     TONSILLECTOMY     TONSILLECTOMY AND ADENOIDECTOMY     Family History  Problem Relation Age of Onset   Cancer Maternal Grandfather    Diabetes Paternal Grandfather    Osteoporosis Mother    Osteoporosis Maternal Grandmother    Hypertension Paternal Grandmother    Cancer Father    Cancer Maternal Uncle    Diabetes Paternal Uncle    Allergic rhinitis Son    Angioedema Neg Hx    Asthma Neg Hx    Atopy Neg Hx    Eczema Neg Hx    Social History   Socioeconomic History   Marital status: Legally Separated    Spouse name: Not on file   Number of children: 1   Years of education: Not on file   Highest education  level: Not on file  Occupational History   Occupation: Retired  Tobacco Use   Smoking status: Every Day    Packs/day: 0.50    Years: 15.00    Additional pack years: 0.00    Total pack years: 7.50    Types: Cigarettes   Smokeless tobacco: Never  Substance and Sexual Activity   Alcohol use: No   Drug use: No   Sexual activity: Not Currently    Birth control/protection: Surgical  Other Topics Concern   Not on file  Social History Narrative   Raises chickens and loves being outdoors   Live alone.   1 son, grandchildren and 1 great great grandson   Social Determinants of Health    Financial Resource Strain: Medium Risk (04/02/2023)   Overall Financial Resource Strain (CARDIA)    Difficulty of Paying Living Expenses: Somewhat hard  Food Insecurity: Food Insecurity Present (04/02/2023)   Hunger Vital Sign    Worried About Running Out of Food in the Last Year: Sometimes true    Ran Out of Food in the Last Year: Often true  Transportation Needs: No Transportation Needs (04/02/2023)   PRAPARE - Administrator, Civil Service (Medical): No    Lack of Transportation (Non-Medical): No  Physical Activity: Sufficiently Active (04/02/2023)   Exercise Vital Sign    Days of Exercise per Week: 5 days    Minutes of Exercise per Session: 150+ min  Stress: Stress Concern Present (04/02/2023)   [employer] of Occupational Health - Occupational Stress Questionnaire    Feeling of Stress : To some extent  Social Connections: Moderately Isolated (04/02/2023)   Social Connection and Isolation Panel [NHANES]    Frequency of Communication with Friends and Family: Twice a week    Frequency of Social Gatherings with Friends and Family: Once a week    Attends Religious Services: Never    Database administrator or Organizations: No    Attends Engineer, structural: Never    Marital Status: Married    Tobacco Counseling Ready to quit: Yes Counseling given: Yes   Clinical Intake:  Pre-visit preparation completed: Yes  Pain : No/denies pain     BMI - recorded: 31.64 Nutritional Status: BMI > 30  Obese Nutritional Risks: None Diabetes: No  How often do you need to have someone help you when you read instructions, pamphlets, or other written materials from your doctor or pharmacy?: 1 - Never  Interpreter Needed?: No  Information entered by ::  Nicie Milan, CMA   Activities of Daily Living    04/02/2023    1:59 PM 04/01/2023    9:14 AM  In your present state of health, do you have any difficulty performing the following activities:  Hearing? 0 0   Vision? 0 0  Difficulty concentrating or making decisions? 0 0  Walking or climbing stairs? 0 0  Dressing or bathing? 0 0  Doing errands, shopping? 0 0  Preparing Food and eating ? N N  Using the Toilet? N N  In the past six months, have you accidently leaked urine? Y Y  Do you have problems with loss of bowel control? N N  Managing your Medications? N N  Managing your Finances? N N  Housekeeping or managing your Housekeeping? N N    Patient Care Team: Babs Sciara, MD as PCP - General (Family Medicine)  Indicate any recent Medical Services you may have received from other than [facility] providers in the past year (  date may be approximate).     Assessment:   This is a routine wellness examination for Katrina Dean.  Hearing/Vision screen Hearing Screening - Comments:: Patient denies any hearing difficulties.    Dietary issues and exercise activities discussed:     Goals Addressed             This Visit's Progress    Patient Stated       Stop smoking        Depression Screen    04/02/2023    1:59 PM 03/31/2023   10:46 AM 03/27/2022    9:14 AM 03/24/2022    2:09 PM 03/13/2021    8:29 AM 02/16/2020   10:50 AM 05/03/2017   11:10 AM  PHQ 2/9 Scores  PHQ - 2 Score 0 0 2 0 0 1 2  PHQ- 9 Score 2 2 12   5 3     Fall Risk    04/02/2023    1:59 PM 04/01/2023    9:14 AM 03/31/2023   10:46 AM 03/27/2022    8:16 AM 03/24/2022    2:13 PM  Fall Risk   Falls in the past year? 1 1 0 0 0  Number falls in past yr: 0 0 0 0 0  Injury with Fall? 0 0 0 0 0  Risk for fall due to : No Fall Risks   No Fall Risks No Fall Risks  Follow up Falls prevention discussed   Falls evaluation completed Falls prevention discussed    MEDICARE RISK AT HOME:  Medicare Risk at Home - 04/02/23 1356     Any stairs in or around the home? Yes    If so, are there any without handrails? No    Home free of loose throw rugs in walkways, pet beds, electrical cords, etc? Yes    Adequate lighting in your home  to reduce risk of falls? Yes    Life alert? No    Use of a cane, walker or w/c? No    Grab bars in the bathroom? No    Shower chair or bench in shower? No    Elevated toilet seat or a handicapped toilet? No             TIMED UP AND GO:  Was the test performed?  No    Cognitive Function:        04/02/2023    1:56 PM 03/24/2022    2:16 PM  6CIT Screen  What Year? 0 points 0 points  What month? 0 points 0 points  What time? 0 points 0 points  Count back from 20 0 points 0 points  Months in reverse 0 points 0 points  Repeat phrase 0 points 0 points  Total Score 0 points 0 points    Immunizations Immunization History  Administered Date(s) Administered   Moderna Sars-Covid-2 Vaccination 07/11/2020   Tdap 12/26/2015    TDAP status: Up to date  Flu Vaccine status: Up to date  Pneumococcal vaccine status: Due, Education has been provided regarding the importance of this vaccine. Advised may receive this vaccine at local pharmacy or Health Dept. Aware to provide a copy of the vaccination record if obtained from local pharmacy or Health Dept. Verbalized acceptance and understanding.  Covid-19 vaccine status: Information provided on how to obtain vaccines.   Qualifies for Shingles Vaccine? Yes   Zostavax completed No   Shingrix Completed?: No.    Education has been provided regarding the importance of this vaccine. Patient has  been advised to call insurance company to determine out of pocket expense if they have not yet received this vaccine. Advised may also receive vaccine at local pharmacy or Health Dept. Verbalized acceptance and understanding.  Screening Tests Health Maintenance  Topic Date Due   Zoster Vaccines- Shingrix (1 of 2) Never done   Pneumonia Vaccine 14+ Years old (1 of 1 - PCV) Never done   COVID-19 Vaccine (2 - Moderna risk series) 08/08/2020   INFLUENZA VACCINE  05/06/2023   Medicare Annual Wellness (AWV)  03/30/2024   MAMMOGRAM  10/26/2024   Fecal  DNA (Cologuard)  02/09/2025   DTaP/Tdap/Td (2 - Td or Tdap) 12/25/2025   DEXA SCAN  Completed   Hepatitis C Screening  Completed   HPV VACCINES  Aged Out    Health Maintenance  Health Maintenance Due  Topic Date Due   Zoster Vaccines- Shingrix (1 of 2) Never done   Pneumonia Vaccine 29+ Years old (1 of 1 - PCV) Never done   COVID-19 Vaccine (2 - Moderna risk series) 08/08/2020    Colorectal cancer screening: Type of screening: Cologuard. Completed 02/09/22. Repeat every 3 years  Mammogram status: Completed 10/26/22. Repeat every year  Bone Density status:  Has an appt to have on April 19, 2023  Lung Cancer Screening: (Low Dose CT Chest recommended if Age 8-80 years, 20 pack-year currently smoking OR have quit w/in 15years.) does not qualify.   Lung Cancer Screening Referral: n/a  Additional Screening:  Hepatitis C Screening: does not qualify; Completed 12/26/2015  Vision Screening: Recommended annual ophthalmology exams for early detection of glaucoma and other disorders of the eye. Is the patient up to date with their annual eye exam?  Yes  Who is the provider or what is the name of the office in which the patient attends annual eye exams? [facility] If pt is not established with a provider, would they like to be referred to a provider to establish care? No .   Dental Screening: Recommended annual dental exams for proper oral hygiene  Diabetic Foot Exam: n/a  Community Resource Referral / Chronic Care Management: CRR required this visit?  No   CCM required this visit?  No     Plan:     I have personally reviewed and noted the following in the patient's chart:   Medical and social history Use of alcohol, tobacco or illicit drugs  Current medications and supplements including opioid prescriptions. Patient is not currently taking opioid prescriptions. Functional ability and status Nutritional status Physical activity Advanced directives List of other  physicians Hospitalizations, surgeries, and ER visits in previous 12 months Vitals Screenings to include cognitive, depression, and falls Referrals and appointments  In addition, I have reviewed and discussed with patient certain preventive protocols, quality metrics, and best practice recommendations. A written personalized care plan for preventive services as well as general preventive health recommendations were provided to patient.   Any medications not marked as taking were not mentioned by the patient (or their caregiver if applicable) when reconciling the medications.  Because this visit was a virtual/telehealth visit,  certain criteria was not obtained, such a blood pressure, CBG if patient is a diabetic, and timed up and go.    Jordan Hawks Shaquel Josephson, CMA   04/02/2023   After Visit Summary: (MyChart) Due to this being a telephonic visit, the after visit summary with patients personalized plan was offered to patient via MyChart   Nurse Notes: patient aware of vaccines that are due

## 2023-04-02 NOTE — Patient Instructions (Signed)
Ms. Katrina Dean , Thank you for taking time to come for your Medicare Wellness Visit. I appreciate your ongoing commitment to your health goals. Please review the following plan we discussed and let me know if I can assist you in the future.   These are the goals we discussed:  Goals      Exercise 3x per week (30 min per time)     Stay active and healthy.      Patient Stated     Stop smoking         This is a list of the screening recommended for you and due dates:  Health Maintenance  Topic Date Due   Zoster (Shingles) Vaccine (1 of 2) Never done   Pneumonia Vaccine (1 of 1 - PCV) Never done   COVID-19 Vaccine (2 - Moderna risk series) 08/08/2020   Flu Shot  05/06/2023   Medicare Annual Wellness Visit  04/01/2024   Mammogram  10/26/2024   Cologuard (Stool DNA test)  02/09/2025   DTaP/Tdap/Td vaccine (2 - Td or Tdap) 12/25/2025   DEXA scan (bone density measurement)  Completed   Hepatitis C Screening  Completed   HPV Vaccine  Aged Out    Advanced directives: Advance directive discussed with you today. Even though you declined this today, please call our office should you change your mind, and we can give you the proper paperwork for you to fill out. Advance care planning is a way to make decisions about medical care that fits your values in case you are ever unable to make these decisions for yourself.  Information on Advanced Care Planning can be found at [facility] Advance Health Care Directives Advance Health Care Directives ([email or web address]     Conditions/risks identified: You are due for the vaccines checked below. You may have these done at your preferred pharmacy. Please have them fax the office proof of the vaccines so that we can update your chart.   [x]  Shingrix (Shingles vaccine) [x]  Pneumonia Vaccines []  TDAP (Tetanus) Vaccine [x]  Covid-19  Steps to Quit Smoking Smoking tobacco is the leading cause of preventable death. It can affect almost every  organ in the body. Smoking puts you and people around you at risk for many serious, long-lasting (chronic) diseases. Quitting smoking can be hard, but it is one of the best things that you can do for your health. It is never too late to quit. Do not give up if you cannot quit the first time. Some people need to try many times to quit. Do your best to stick to your quit plan, and talk with your doctor if you have any questions or concerns. How do I get ready to quit? Pick a date to quit. Set a date within the next 2 weeks to give you time to prepare. Write down the reasons why you are quitting. Keep this list in places where you will see it often. Tell your family, friends, and co-workers that you are quitting. Their support is important. Talk with your doctor about the choices that may help you quit. Find out if your health insurance will pay for these treatments. Know the people, places, things, and activities that make you want to smoke (triggers). Avoid them. What first steps can I take to quit smoking? Throw away all cigarettes at home, at work, and in your car. Throw away the things that you use when you smoke, such as ashtrays and lighters. Clean your car. Empty the ashtray. Clean  your home, including curtains and carpets. What can I do to help me quit smoking? Talk with your doctor about taking medicines and seeing a counselor. You are more likely to succeed when you do both. If you are pregnant or breastfeeding: Talk with your doctor about counseling or other ways to quit smoking. Do not take medicine to help you quit smoking unless your doctor tells you to. Quit right away Quit smoking completely, instead of slowly cutting back on how much you smoke over a period of time. Stopping smoking right away may be more successful than slowly quitting. Go to counseling. In-person is best if this is an option. You are more likely to quit if you go to counseling sessions regularly. Take  medicine You may take medicines to help you quit. Some medicines need a prescription, and some you can buy over-the-counter. Some medicines may contain a drug called nicotine to replace the nicotine in cigarettes. Medicines may: Help you stop having the desire to smoke (cravings). Help to stop the problems that come when you stop smoking (withdrawal symptoms). Your doctor may ask you to use: Nicotine patches, gum, or lozenges. Nicotine inhalers or sprays. Non-nicotine medicine that you take by mouth. Find resources Find resources and other ways to help you quit smoking and remain smoke-free after you quit. They include: Online chats with a Veterinary surgeon. Phone quitlines. Printed Materials engineer. Support groups or group counseling. Text messaging programs. Mobile phone apps. Use apps on your mobile phone or tablet that can help you stick to your quit plan. Examples of free services include Quit Guide from the CDC and smokefree.gov  What can I do to make it easier to quit?  Talk to your family and friends. Ask them to support and encourage you. Call a phone quitline, such as 1-800-QUIT-NOW, reach out to support groups, or work with a Veterinary surgeon. Ask people who smoke to not smoke around you. Avoid places that make you want to smoke, such as: Bars. Parties. Smoke-break areas at work. Spend time with people who do not smoke. Lower the stress in your life. Stress can make you want to smoke. Try these things to lower stress: Getting regular exercise. Doing deep-breathing exercises. Doing yoga. Meditating. What benefits will I see if I quit smoking? Over time, you may have: A better sense of smell and taste. Less coughing and sore throat. A slower heart rate. Lower blood pressure. Clearer skin. Better breathing. Fewer sick days. Summary Quitting smoking can be hard, but it is one of the best things that you can do for your health. Do not give up if you cannot quit the first time.  Some people need to try many times to quit. When you decide to quit smoking, make a plan to help you succeed. Quit smoking right away, not slowly over a period of time. When you start quitting, get help and support to keep you smoke-free. This information is not intended to replace advice given to you by your health care provider. Make sure you discuss any questions you have with your health care provider. Document Revised: 09/12/2021 Document Reviewed: 09/12/2021 Elsevier Patient Education  2024 Elsevier Inc.    Next appointment: VIRTUAL/TELEPHONE APPOINTMENT Follow up in one year for your annual wellness visit  April 14, 2024 at 1:00pm telehphone visit   Preventive Care 65 Years and Older, Female Preventive care refers to lifestyle choices and visits with your health care provider that can promote health and wellness. What does preventive care include? A  yearly physical exam. This is also called an annual well check. Dental exams once or twice a year. Routine eye exams. Ask your health care provider how often you should have your eyes checked. Personal lifestyle choices, including: Daily care of your teeth and gums. Regular physical activity. Eating a healthy diet. Avoiding tobacco and drug use. Limiting alcohol use. Practicing safe sex. Taking low-dose aspirin every day. Taking vitamin and mineral supplements as recommended by your health care provider. What happens during an annual well check? The services and screenings done by your health care provider during your annual well check will depend on your age, overall health, lifestyle risk factors, and family history of disease. Counseling  Your health care provider may ask you questions about your: Alcohol use. Tobacco use. Drug use. Emotional well-being. Home and relationship well-being. Sexual activity. Eating habits. History of falls. Memory and ability to understand (cognition). Work and work  Astronomer. Reproductive health. Screening  You may have the following tests or measurements: Height, weight, and BMI. Blood pressure. Lipid and cholesterol levels. These may be checked every 5 years, or more frequently if you are over 93 years old. Skin check. Lung cancer screening. You may have this screening every year starting at age 86 if you have a 30-pack-year history of smoking and currently smoke or have quit within the past 15 years. Fecal occult blood test (FOBT) of the stool. You may have this test every year starting at age 8. Flexible sigmoidoscopy or colonoscopy. You may have a sigmoidoscopy every 5 years or a colonoscopy every 10 years starting at age 19. Hepatitis C blood test. Hepatitis B blood test. Sexually transmitted disease (STD) testing. Diabetes screening. This is done by checking your blood sugar (glucose) after you have not eaten for a while (fasting). You may have this done every 1-3 years. Bone density scan. This is done to screen for osteoporosis. You may have this done starting at age 110. Mammogram. This may be done every 1-2 years. Talk to your health care provider about how often you should have regular mammograms. Talk with your health care provider about your test results, treatment options, and if necessary, the need for more tests. Vaccines  Your health care provider may recommend certain vaccines, such as: Influenza vaccine. This is recommended every year. Tetanus, diphtheria, and acellular pertussis (Tdap, Td) vaccine. You may need a Td booster every 10 years. Zoster vaccine. You may need this after age 17. Pneumococcal 13-valent conjugate (PCV13) vaccine. One dose is recommended after age 82. Pneumococcal polysaccharide (PPSV23) vaccine. One dose is recommended after age 37. Talk to your health care provider about which screenings and vaccines you need and how often you need them. This information is not intended to replace advice given to you by  your health care provider. Make sure you discuss any questions you have with your health care provider. Document Released: 10/18/2015 Document Revised: 06/10/2016 Document Reviewed: 07/23/2015 Elsevier Interactive Patient Education  2017 [employer].  Fall Prevention in the Home Falls can cause injuries. They can happen to people of all ages. There are many things you can do to make your home safe and to help prevent falls. What can I do on the outside of my home? Regularly fix the edges of walkways and driveways and fix any cracks. Remove anything that might make you trip as you walk through a door, such as a raised step or threshold. Trim any bushes or trees on the path to your home. Use bright  outdoor lighting. Clear any walking paths of anything that might make someone trip, such as rocks or tools. Regularly check to see if handrails are loose or broken. Make sure that both sides of any steps have handrails. Any raised decks and porches should have guardrails on the edges. Have any leaves, snow, or ice cleared regularly. Use sand or salt on walking paths during winter. Clean up any spills in your garage right away. This includes oil or grease spills. What can I do in the bathroom? Use night lights. Install grab bars by the toilet and in the tub and shower. Do not use towel bars as grab bars. Use non-skid mats or decals in the tub or shower. If you need to sit down in the shower, use a plastic, non-slip stool. Keep the floor dry. Clean up any water that spills on the floor as soon as it happens. Remove soap buildup in the tub or shower regularly. Attach bath mats securely with double-sided non-slip rug tape. Do not have throw rugs and other things on the floor that can make you trip. What can I do in the bedroom? Use night lights. Make sure that you have a light by your bed that is easy to reach. Do not use any sheets or blankets that are too big for your bed. They should not hang  down onto the floor. Have a firm chair that has side arms. You can use this for support while you get dressed. Do not have throw rugs and other things on the floor that can make you trip. What can I do in the kitchen? Clean up any spills right away. Avoid walking on wet floors. Keep items that you use a lot in easy-to-reach places. If you need to reach something above you, use a strong step stool that has a grab bar. Keep electrical cords out of the way. Do not use floor polish or wax that makes floors slippery. If you must use wax, use non-skid floor wax. Do not have throw rugs and other things on the floor that can make you trip. What can I do with my stairs? Do not leave any items on the stairs. Make sure that there are handrails on both sides of the stairs and use them. Fix handrails that are broken or loose. Make sure that handrails are as long as the stairways. Check any carpeting to make sure that it is firmly attached to the stairs. Fix any carpet that is loose or worn. Avoid having throw rugs at the top or bottom of the stairs. If you do have throw rugs, attach them to the floor with carpet tape. Make sure that you have a light switch at the top of the stairs and the bottom of the stairs. If you do not have them, ask someone to add them for you. What else can I do to help prevent falls? Wear shoes that: Do not have high heels. Have rubber bottoms. Are comfortable and fit you well. Are closed at the toe. Do not wear sandals. If you use a stepladder: Make sure that it is fully opened. Do not climb a closed stepladder. Make sure that both sides of the stepladder are locked into place. Ask someone to hold it for you, if possible. Clearly mark and make sure that you can see: Any grab bars or handrails. First and last steps. Where the edge of each step is. Use tools that help you move around (mobility aids) if they are needed. These  include: Canes. Walkers. Scooters. Crutches. Turn on the lights when you go into a dark area. Replace any light bulbs as soon as they burn out. Set up your furniture so you have a clear path. Avoid moving your furniture around. If any of your floors are uneven, fix them. If there are any pets around you, be aware of where they are. Review your medicines with your doctor. Some medicines can make you feel dizzy. This can increase your chance of falling. Ask your doctor what other things that you can do to help prevent falls. This information is not intended to replace advice given to you by your health care provider. Make sure you discuss any questions you have with your health care provider. Document Released: 07/18/2009 Document Revised: 02/27/2016 Document Reviewed: 10/26/2014 Elsevier Interactive Patient Education  2017 [employer].

## 2023-04-19 ENCOUNTER — Other Ambulatory Visit (HOSPITAL_COMMUNITY): Payer: Medicare HMO

## 2023-04-27 ENCOUNTER — Encounter (HOSPITAL_COMMUNITY): Payer: Self-pay

## 2023-04-27 ENCOUNTER — Other Ambulatory Visit (HOSPITAL_COMMUNITY): Payer: Medicare HMO

## 2023-05-31 ENCOUNTER — Ambulatory Visit (INDEPENDENT_AMBULATORY_CARE_PROVIDER_SITE_OTHER): Payer: Medicare HMO | Admitting: Nurse Practitioner

## 2023-05-31 VITALS — BP 132/80 | HR 92 | Temp 100.2°F | Ht 60.0 in | Wt 162.6 lb

## 2023-05-31 DIAGNOSIS — M25511 Pain in right shoulder: Secondary | ICD-10-CM | POA: Diagnosis not present

## 2023-05-31 DIAGNOSIS — G8929 Other chronic pain: Secondary | ICD-10-CM | POA: Diagnosis not present

## 2023-05-31 DIAGNOSIS — R062 Wheezing: Secondary | ICD-10-CM | POA: Diagnosis not present

## 2023-05-31 DIAGNOSIS — J069 Acute upper respiratory infection, unspecified: Secondary | ICD-10-CM

## 2023-05-31 DIAGNOSIS — M25421 Effusion, right elbow: Secondary | ICD-10-CM | POA: Diagnosis not present

## 2023-05-31 MED ORDER — PREDNISONE 20 MG PO TABS
ORAL_TABLET | ORAL | 0 refills | Status: DC
Start: 2023-05-31 — End: 2024-07-17

## 2023-05-31 MED ORDER — DOXYCYCLINE HYCLATE 100 MG PO TABS: 100.0000 mg | ORAL_TABLET | Freq: Two times a day (BID) | ORAL | 0 refills | Status: DC

## 2023-05-31 NOTE — Patient Instructions (Addendum)
Katrina Dean orthopedics [place] office

## 2023-05-31 NOTE — Progress Notes (Signed)
   Subjective:    Patient ID: Katrina Dean, female    DOB: [birth year], 72 y.o.   MRN: [id number]  HPI Pt comes in today because she has developed a fluid pocket from her torn rotator cuff, Pt states large pocket of fluid in the right elbow for approx 3 days.  Also chronic left shoulder pain.  Requesting referral to orthopedic specialist. Pt also complains of cough for the past week or so along with a wheeze when breathing and runny nose. Noticed slight wheezing after she had been outside.  Has several albuterol inhalers at home if she needs them.  No fever.  Some chills.  Cough worse at night.  Mostly nonproductive.  Slight shortness of breath only with prolonged cough.  No chest pain or tightness.  Producing clear mucus.  No sore throat or ear pain.  Taking fluids well.  Slight nausea, no vomiting.  Voiding normal limit.        Objective:   Physical Exam NAD.  Alert, oriented.  TMs clear effusion, no erythema.  Pharynx mildly erythematous and moist.  Clear PND noted.  Neck supple with mild soft anterior adenopathy.  Lungs breath sounds diminished in general with faint expiratory wheezes in the upper lobes, otherwise clear.  No tachypnea.  Heart regular rate rhythm. Today's Vitals   05/31/23 1113  BP: 132/80  Pulse: 92  Temp: 100.2 F (37.9 C)  SpO2: 90%  Weight: 162 lb 9.6 oz (73.8 kg)  Height: 5' (1.524 m)   Body mass index is 31.76 kg/m.        Assessment & Plan:   Problem List Items Addressed This Visit       Musculoskeletal and Integument   Effusion of right olecranon bursa   Relevant Orders   Ambulatory referral to Orthopedic Surgery     Other   Chronic right shoulder pain   Relevant Medications   predniSONE (DELTASONE) 20 MG tablet   Other Relevant Orders   Ambulatory referral to Orthopedic Surgery   Wheezing   Relevant Orders   COVID-19, Flu A+B and RSV   Other Visit Diagnoses     Viral URI with cough    -  Primary   Relevant Orders   COVID-19, Flu  A+B and RSV         Meds ordered this encounter  Medications   predniSONE (DELTASONE) 20 MG tablet    Sig: Take one tab po BID x 5 days    Dispense:  10 tablet    Refill:  0    Order Specific Question:   Supervising Provider    Answer:   Lilyan Punt A [[id number]]   doxycycline (VIBRA-TABS) 100 MG tablet    Sig: Take 1 tablet (100 mg total) by mouth 2 (two) times daily.    Dispense:  20 tablet    Refill:  0    Order Specific Question:   Supervising Provider    Answer:   Lilyan Punt A [[id number]]   Follow up with orthopedics regarding shoulder and elbow problems.  Start Prednisone as directed. Restart albuterol inhaler as directed prn. Hold on Doxycycline unless needed. Warning signs reviewed. Call back if worsens or persists.

## 2023-06-01 ENCOUNTER — Encounter: Payer: Self-pay | Admitting: Nurse Practitioner

## 2023-06-01 DIAGNOSIS — M7021 Olecranon bursitis, right elbow: Secondary | ICD-10-CM | POA: Diagnosis not present

## 2023-06-01 DIAGNOSIS — M25511 Pain in right shoulder: Secondary | ICD-10-CM | POA: Diagnosis not present

## 2023-06-02 LAB — COVID-19, FLU A+B AND RSV
Influenza A, NAA: NOT DETECTED
Influenza B, NAA: NOT DETECTED
RSV, NAA: NOT DETECTED
SARS-CoV-2, NAA: NOT DETECTED

## 2023-06-25 ENCOUNTER — Other Ambulatory Visit: Payer: Self-pay | Admitting: Family Medicine

## 2023-07-06 ENCOUNTER — Other Ambulatory Visit: Payer: Self-pay | Admitting: Family Medicine

## 2023-07-27 DIAGNOSIS — M25511 Pain in right shoulder: Secondary | ICD-10-CM | POA: Diagnosis not present

## 2023-07-28 ENCOUNTER — Other Ambulatory Visit: Payer: Self-pay | Admitting: Orthopaedic Surgery

## 2023-07-28 DIAGNOSIS — M25511 Pain in right shoulder: Secondary | ICD-10-CM

## 2023-08-03 DIAGNOSIS — M19011 Primary osteoarthritis, right shoulder: Secondary | ICD-10-CM | POA: Diagnosis not present

## 2023-08-10 ENCOUNTER — Ambulatory Visit
Admission: RE | Admit: 2023-08-10 | Discharge: 2023-08-10 | Disposition: A | Discharge status: Home / Self Care | Payer: Medicare HMO | Source: Ambulatory Visit | Attending: Orthopaedic Surgery

## 2023-08-10 DIAGNOSIS — M19011 Primary osteoarthritis, right shoulder: Secondary | ICD-10-CM | POA: Diagnosis not present

## 2023-08-10 DIAGNOSIS — M25511 Pain in right shoulder: Secondary | ICD-10-CM | POA: Diagnosis not present

## 2023-08-13 ENCOUNTER — Ambulatory Visit (INDEPENDENT_AMBULATORY_CARE_PROVIDER_SITE_OTHER): Payer: Medicare HMO | Admitting: Nurse Practitioner

## 2023-08-13 ENCOUNTER — Encounter: Payer: Self-pay | Admitting: Nurse Practitioner

## 2023-08-13 VITALS — BP 125/87 | HR 90 | Temp 98.4°F | Ht 60.0 in | Wt 160.8 lb

## 2023-08-13 DIAGNOSIS — Z87891 Personal history of nicotine dependence: Secondary | ICD-10-CM | POA: Diagnosis not present

## 2023-08-13 DIAGNOSIS — Z01818 Encounter for other preprocedural examination: Secondary | ICD-10-CM

## 2023-08-13 DIAGNOSIS — F419 Anxiety disorder, unspecified: Secondary | ICD-10-CM | POA: Diagnosis not present

## 2023-08-13 MED ORDER — CLONAZEPAM 0.5 MG PO TABS: ORAL_TABLET | ORAL | 0 refills | Status: DC

## 2023-08-13 NOTE — Progress Notes (Signed)
Subjective:    Patient ID: Katrina Dean, female    DOB: May 25, 1951, 72 y.o.   MRN: [id number]  HPI Presents for preop clearance for upcoming shoulder surgery.  Patient states she quit smoking about 3 weeks ago.  No fevers.  No urinary symptoms.  Has been experiencing a great deal of anxiety especially at nighttime worrying about her surgery.  Has taken low-dose Klonopin in the past but has been off of this for a long time. Her preop form indicates that she may need chest x-ray and EKG for preop.  Lab work will be done through their office.   Review of Systems  Constitutional:  Positive for fatigue. Negative for fever.  Respiratory:  Negative for cough, chest tightness, shortness of breath and wheezing.   Cardiovascular:  Negative for chest pain and leg swelling.  Gastrointestinal:  Negative for abdominal pain, blood in stool, constipation, diarrhea, nausea and vomiting.  Genitourinary:  Negative for difficulty urinating, dysuria, enuresis, frequency and urgency.  Psychiatric/Behavioral:  Positive for sleep disturbance. The patient is nervous/anxious.       08/13/2023   10:50 AM  Depression screen PHQ 2/9  Decreased Interest 1  Down, Depressed, Hopeless 1  PHQ - 2 Score 2  Altered sleeping 2  Tired, decreased energy 2  Change in appetite 2  Feeling bad or failure about yourself  0  Trouble concentrating 0  Moving slowly or fidgety/restless 0  Suicidal thoughts 0  PHQ-9 Score 8  Difficult doing work/chores Somewhat difficult      08/13/2023   10:50 AM 03/31/2023   10:46 AM 03/27/2022    9:15 AM 07/26/2020   12:25 PM  GAD 7 : Generalized Anxiety Score  Nervous, Anxious, on Edge 2 0 1 1  Control/stop worrying 2 1 2 2   Worry too much - different things 2 1 2 2   Trouble relaxing 2 1 2 2   Restless 2 0 2 2  Easily annoyed or irritable 2 1 3 2   Afraid - awful might happen 1 0 0 0  Total GAD 7 Score 13 4 12 11   Anxiety Difficulty Somewhat difficult  Somewhat difficult  Somewhat difficult   Social History   Tobacco Use   Smoking status: Every Day    Current packs/day: 0.50    Average packs/day: 0.5 packs/day for 15.0 years (7.5 ttl pk-yrs)    Types: Cigarettes   Smokeless tobacco: Never  Substance Use Topics   Alcohol use: No   Drug use: No    Past Medical History:  Diagnosis Date   Allergy Years ago   Seasonal   Cataract 3 years ago   Moderate persistent asthma, uncomplicated 06/07/2018   Osteopenia 2010   Seasonal allergies        Objective:   Physical Exam NAD.  Alert, oriented.  Mildly fatigued in appearance.  Lungs clear.  Breath sounds diminished in general.  No tachypnea.  Heart regular rate rhythm.  Abdomen soft nondistended nontender.  Lower extremities no edema. EKG shows normal sinus rhythm. Today's Vitals   08/13/23 1042  BP: 125/87  Pulse: 90  Temp: 98.4 F (36.9 C)  SpO2: 94%  Weight: 160 lb 12.8 oz (72.9 kg)  Height: 5' (1.524 m)   Body mass index is 31.4 kg/m.        Assessment & Plan:   Problem List Items Addressed This Visit       Other   Anxiety   Other Visit Diagnoses     Preoperative  clearance    -  Primary   Relevant Orders   EKG 12-Lead (Completed)   DG Chest 2 View   History of cigarette smoking          Meds ordered this encounter  Medications   clonazePAM (KLONOPIN) 0.5 MG tablet    Sig: Take 1/2-1 tab po BID prn anxiety    Dispense:  30 tablet    Refill:  0    Order Specific Question:   Supervising Provider    Answer:   Lilyan Punt A [[id number]]   Because of her history of cigarette smoking, will obtain a chest x-ray.  No UA is needed at this time.  All of the items requested on the form have been addressed.  Patient is cleared for surgery if her chest x-ray is normal. Encourage patient to continue her smoking cessation. Given short-term prescription for clonazepam to take at as directed for extreme anxiety related to her upcoming surgery. Recheck here as needed.

## 2023-08-16 ENCOUNTER — Ambulatory Visit (HOSPITAL_COMMUNITY)
Admission: RE | Admit: 2023-08-16 | Discharge: 2023-08-16 | Disposition: A | Discharge status: Home / Self Care | Payer: Medicare HMO | Source: Ambulatory Visit | Attending: Nurse Practitioner | Admitting: Nurse Practitioner

## 2023-08-16 DIAGNOSIS — Z01818 Encounter for other preprocedural examination: Secondary | ICD-10-CM | POA: Diagnosis not present

## 2023-08-17 ENCOUNTER — Telehealth: Payer: Self-pay

## 2023-08-17 NOTE — Telephone Encounter (Signed)
Copied from CRM [id number]. Topic: Clinical - Lab/Test Results >> Aug 17, 2023  2:22 PM Theodis Sato wrote: PT is looking for her EKG results and chest X-ray results because she is waiting for those in order for her to be able to schedule a surgery.

## 2023-08-18 DIAGNOSIS — M19011 Primary osteoarthritis, right shoulder: Secondary | ICD-10-CM | POA: Diagnosis not present

## 2023-08-18 NOTE — Telephone Encounter (Signed)
Called and spoke with patient and advised per Eber Jones NP Still waiting on radiology report on xray. I have her papers. Just waiting on this. There are delays on all of these. Patient verbalized understanding.

## 2023-08-19 ENCOUNTER — Encounter: Payer: Self-pay | Admitting: Nurse Practitioner

## 2023-08-30 DIAGNOSIS — M19011 Primary osteoarthritis, right shoulder: Secondary | ICD-10-CM | POA: Diagnosis not present

## 2023-08-30 DIAGNOSIS — M25511 Pain in right shoulder: Secondary | ICD-10-CM | POA: Diagnosis not present

## 2023-09-17 ENCOUNTER — Other Ambulatory Visit: Payer: Self-pay | Admitting: Family Medicine

## 2023-09-17 DIAGNOSIS — M19011 Primary osteoarthritis, right shoulder: Secondary | ICD-10-CM | POA: Diagnosis not present

## 2023-10-10 ENCOUNTER — Telehealth: Payer: Self-pay | Admitting: Family Medicine

## 2023-10-10 NOTE — Telephone Encounter (Signed)
 The patient's right shoulder arthroplasty by Beverley and Jane was canceled due to elevated blood pressure  She has an appointment coming up with Elveria Quarry on the 10th  I do not think that this is correct-Carolyn is out of the office on the 10th.  I would recommend switching Lynise over to my schedule Please reach out to Liora and get her scheduled on my schedule for the 10th regarding this issue

## 2023-10-15 ENCOUNTER — Ambulatory Visit: Payer: Medicare HMO | Admitting: Nurse Practitioner

## 2023-10-28 ENCOUNTER — Telehealth: Payer: Self-pay

## 2023-10-28 DIAGNOSIS — Z87891 Personal history of nicotine dependence: Secondary | ICD-10-CM

## 2023-10-28 DIAGNOSIS — F17210 Nicotine dependence, cigarettes, uncomplicated: Secondary | ICD-10-CM

## 2023-10-28 DIAGNOSIS — Z122 Encounter for screening for malignant neoplasm of respiratory organs: Secondary | ICD-10-CM

## 2023-10-28 NOTE — Telephone Encounter (Signed)
.  Lung Cancer Screening Narrative/Criteria Questionnaire (Cigarette Smokers Only- No Cigars/Pipes/vapes)   Katrina Dean   SDMV:11/09/2023 @ 9:30 with Katrina Amor, RN   06-02-1951   LDCT: 11/12/2023 at 9:30am @[facility]     72 y.o.   Phone: [phone]  Lung Screening Narrative (confirm age 87-77 yrs Medicare / 50-80 yrs Private pay insurance)   Insurance information:[employer] Medicare   Referring Provider:[facility]   This screening involves an initial phone call with a team member from our program. It is called a shared decision making visit. The initial meeting is required by  insurance and Medicare to make sure you understand the program. This appointment takes about 15-20 minutes to complete. You will complete the screening scan at your scheduled date/time.  This scan takes about 5-10 minutes to complete. You can eat and drink normally before and after the scan.  Criteria questions for Lung Cancer Screening:   Are you a current or former smoker? Current Age began smoking: 26   If you are a former smoker, what year did you quit smoking? Quit 5 (within 15 yrs)   To calculate your smoking history, I need an accurate estimate of how many packs of cigarettes you smoked per day and for how many years. (Not just the number of PPD you are now smoking)   Years smoking 41 x Packs per day 1 = Pack years 41   (at least 20 pack yrs)   (Make sure they understand that we need to know how much they have smoked in the past, not just the number of PPD they are smoking now)  Do you have a personal history of cancer?  No    Do you have a family history of cancer? Yes  (cancer type and and relative) Uncle unknown what type, father spine  Are you coughing up blood?  No  Have you had unexplained weight loss of 15 lbs or more in the last 6 months? No  It looks like you meet all criteria.  When would be a good time for [place] to schedule you for this screening?   Additional information:

## 2023-11-01 ENCOUNTER — Ambulatory Visit: Payer: Self-pay | Admitting: Family Medicine

## 2023-11-01 NOTE — Telephone Encounter (Addendum)
Patient advised of provider's recommendations and scheduled 11/02/23 at 10:40am with Toni Amend PA

## 2023-11-01 NOTE — Telephone Encounter (Signed)
Set her appt for Tuesday and advise her to go to ED sooner if worse

## 2023-11-01 NOTE — Telephone Encounter (Addendum)
Copied from CRM [id number]. Topic: Appointments - Appointment Scheduling >> Nov 01, 2023  2:39 PM Halina Maidens L wrote: Patient/patient representative is calling to schedule an appointment. Refer to attachments for appointment information.  Chief Complaint: Flu symptoms Symptoms: Fever, cough, aches, congestion, wheezing, NVD Frequency: since Saturday Pertinent Negatives: Patient denies SOB Disposition: [] ED /[] Urgent Care (no appt availability in office) / [x] Appointment(In office/virtual)/ []  [facility]/ [] Home Care/ [x] Refused Recommended Disposition /[] [facility]/ []  Follow-up with PCP Additional Notes: Patient called in to report a variety of flu symptoms. Patient reported cough, fever, aches, congestion, NVD, and wheezing at times. Patient stated the cough is the most bothersome symptom. Patient stated her most recent fever was 99.0, taken an hour ago. Patient has been taking Tylenol and Robitussin to help with symptoms. Patient denied difficulty breathing, but stated wheezing is present at times. Advised patient to see a provider today. No availability in patient's PCP office or at other offices within a reasonable driving distance. Advised UC today. Patient refused disposition and requested an appointment in the office tomorrow. Advised that I would route this conversation to the clinic, for their discretion about scheduling an appointment. Advised patient to continue home medication remedies and to call back if symptoms worsen. Patient complied.  Reason for Disposition  Wheezing is present  Answer Assessment - Initial Assessment Questions 1. ONSET: "When did the symptoms begin?"      Saturday  2. SEVERITY: "How bad is the cough today?"      States cough is not bad if not laying flat  3. SPUTUM: "Describe the color of your sputum" (none, dry cough; clear, white, yellow, green)     Clear  4. HEMOPTYSIS: "Are you coughing up any blood?" If so ask: "How much?" (flecks,  streaks, tablespoons, etc.)     Denies  5. DIFFICULTY BREATHING: "Are you having difficulty breathing?" If Yes, ask: "How bad is it?" (e.g., mild, moderate, severe)    - MILD: No SOB at rest, mild SOB with walking, speaks normally in sentences, can lie down, no retractions, pulse < 100.    - MODERATE: SOB at rest, SOB with minimal exertion and prefers to sit, cannot lie down flat, speaks in phrases, mild retractions, audible wheezing, pulse 100-120.    - SEVERE: Very SOB at rest, speaks in single words, struggling to breathe, sitting hunched forward, retractions, pulse > 120      Denies  6. FEVER: "Do you have a fever?" If Yes, ask: "What is your temperature, how was it measured, and when did it start?"     99.0 taken under the tongue about an hour ago  7. CARDIAC HISTORY: "Do you have any history of heart disease?" (e.g., heart attack, congestive heart failure)      Denies  8. LUNG HISTORY: "Do you have any history of lung disease?"  (e.g., pulmonary embolus, asthma, emphysema)     Denies  10. OTHER SYMPTOMS: "Do you have any other symptoms?" (e.g., runny nose, wheezing, chest pain)     Body aches, runny nose, NV, diarrhea, states wheezing present at times  Protocols used: Cough - Acute Productive-A-AH

## 2023-11-02 ENCOUNTER — Ambulatory Visit (INDEPENDENT_AMBULATORY_CARE_PROVIDER_SITE_OTHER): Payer: Medicare HMO | Admitting: Family Medicine

## 2023-11-02 ENCOUNTER — Ambulatory Visit: Payer: Medicare HMO | Admitting: Physician Assistant

## 2023-11-02 VITALS — BP 104/67 | HR 107 | Temp 99.5°F | Ht 60.0 in | Wt 157.6 lb

## 2023-11-02 DIAGNOSIS — J019 Acute sinusitis, unspecified: Secondary | ICD-10-CM | POA: Diagnosis not present

## 2023-11-02 MED ORDER — DOXYCYCLINE HYCLATE 100 MG PO TABS: 100.0000 mg | ORAL_TABLET | Freq: Two times a day (BID) | ORAL | 0 refills | Status: DC

## 2023-11-02 NOTE — Progress Notes (Signed)
   Subjective:    Patient ID: Katrina Dean, female    DOB: 10-10-1950, 73 y.o.   MRN: [id number]  Discussed the use of AI scribe software for clinical note transcription with the patient, who gave verbal consent to proceed.  History of Present Illness   The patient presents with flu-like symptoms including headache, sore throat, and cough.  She has been experiencing flu-like symptoms since Friday, with a worsening of symptoms on Saturday. Today, she feels somewhat better. Her symptoms include headache, sore throat, sore ears, and a dry, irritating cough. No sinus pressure or pain, shortness of breath, or night sweats.  She describes a persistent headache and sore throat, accompanied by sore ears. These symptoms have been present since the onset of her illness.  She has a dry, irritating cough and has used her inhaler two or three times, though not extensively. No shortness of breath.  She experienced vomiting twice and had an episode of diarrhea after drinking coffee this morning. She also reports drainage but denies sinus pressure or pain.  She notes a decrease in energy levels since the onset of her symptoms.         Review of Systems     Objective:    Physical Exam   CHEST: Lungs clear to auscultation, no signs of pneumonia.    General-in no acute distress Eyes-no discharge Lungs-respiratory rate normal, CTA CV-no murmurs,RRR Extremities skin warm dry no edema Neuro grossly normal Behavior normal, alert Eardrums normal mild sinus tenderness       Assessment & Plan:  Assessment and Plan    Influenza-like illness Symptoms of headache, vomiting, cough, sore throat, ear pain, and diarrhea. No shortness of breath or night sweats. Energy level lower than normal. Physical exam unremarkable. -Expect gradual improvement over the next 4-5 days. -If symptoms worsen, contact the office for potential antibiotic treatment.  General Health Maintenance Upcoming scan in  February. -Continue with scheduled scan.  Spouse's Respiratory Illness Not present for evaluation, but reported increased inhaler use. -Encourage spouse to schedule an appointment for evaluation if symptoms persist.      Presumed influenza getting better no need for Tamiflu Secondary rhinosinusitis tenderness pain discomfort with bronchial cough and a smoker doxycycline twice daily for 7 days

## 2023-11-09 ENCOUNTER — Ambulatory Visit: Payer: Medicare HMO | Admitting: Physician Assistant

## 2023-11-09 ENCOUNTER — Encounter: Payer: Self-pay | Admitting: Physician Assistant

## 2023-11-09 DIAGNOSIS — F17210 Nicotine dependence, cigarettes, uncomplicated: Secondary | ICD-10-CM

## 2023-11-09 NOTE — Progress Notes (Signed)
 Virtual Visit via Telephone Note  I connected with Katrina Dean on 11/09/23 at  9:46 AM by telephone and verified that I am speaking with the correct person using two identifiers.  Location: Patient: home Provider: working virtually from home   I discussed the limitations, risks, security and privacy concerns of performing an evaluation and management service by telephone and the availability of in person appointments. I also discussed with the patient that there may be a patient responsible charge related to this service. The patient expressed understanding and agreed to proceed.     Shared Decision Making Visit Lung Cancer Screening Program [phone])   Eligibility: Age 73 Pack Years Smoking History Calculation 52 (# packs/per year x # years smoked) Recent History of coughing up blood  No Unexplained weight loss? No ( >Than 15 pounds within the last 6 months ) Prior History Lung / other cancer No (Diagnosis within the last 5 years already requiring surveillance chest CT Scans). Smoking Status Current Smoker  Visit Components: Discussion included one or more decision making aids. Yes Discussion included risk/benefits of screening. Yes Discussion included potential follow up diagnostic testing for abnormal scans. Yes Discussion included meaning and risk of over diagnosis. Yes Discussion included meaning and risk of False Positives. Yes Discussion included meaning of total radiation exposure. Yes  Counseling Included: Importance of adherence to annual lung cancer LDCT screening. Yes Impact of comorbidities on ability to participate in the program. Yes Ability and willingness to under diagnostic treatment: Yes  Smoking Cessation Counseling: Current Smokers:  Discussed importance of smoking cessation. Yes Information about tobacco cessation classes and interventions provided to patient. Yes Symptomatic Patient. No Diagnosis Code: Tobacco Use Z72.0 Asymptomatic Patient  Yes  Counseling (Intermediate counseling: > three minutes counseling) H9563 Information about tobacco cessation classes and interventions provided to patient. Yes Written Order for Lung Cancer Screening with LDCT placed in Epic. Yes (CT Chest Lung Cancer Screening Low Dose W/O CM) PFH4422 Z12.2-Screening of respiratory organs Z87.891-Personal history of nicotine dependence   I have spent 25 minutes of face to face/ virtual visit  time with the patient discussing the risks and benefits of lung cancer screening. We discussed the above noted topics. We paused at intervals to allow for questions to be asked and answered to ensure understanding.We discussed that the single most powerful action that anyone can take to decrease their risk of developing lung cancer is to quit smoking.  We discussed options for tools to aid in quitting smoking including nicotine replacement therapy, non-nicotine medications, support groups, Quit Smart classes, and behavior modification. We discussed that often times setting smaller, more achievable goals, such as eliminating 1 cigarette a day for a week and then 2 cigarettes a day for a week can be helpful in slowly decreasing the number of cigarettes smoked. I provided  them  with smoking cessation  information  with contact information for community resources, classes, free nicotine replacement therapy, and access to mobile apps, text messaging, and on-line smoking cessation help. I have also provided  them  the office contact information in the event they have any questions. We discussed the time and location of the scan, and that either [name] RN, [name], RN  or I will call / send a letter with the results within 24-72 hours of receiving them. The patient verbalized understanding of all of  the above and had no further questions upon leaving the office. They have my contact information in the event they have any further  questions.  I spent 3 minutes counseling  on smoking cessation and the health risks of continued tobacco abuse.  I explained to the patient that there has been a high incidence of coronary artery disease noted on these exams. I explained that this is a non-gated exam therefore degree or severity cannot be determined. This patient is on statin therapy. I have asked the patient to follow-up with their PCP regarding any incidental finding of coronary artery disease and management with diet or medication as their PCP  feels is clinically indicated. The patient verbalized understanding of the above and had no further questions upon completion of the visit.    Katrina Dean Katrina Milosevic, PA-C

## 2023-11-09 NOTE — Patient Instructions (Signed)

## 2023-11-10 ENCOUNTER — Ambulatory Visit (HOSPITAL_COMMUNITY): Payer: Medicare HMO

## 2023-11-12 ENCOUNTER — Telehealth: Payer: Self-pay

## 2023-11-12 ENCOUNTER — Other Ambulatory Visit: Payer: Self-pay | Admitting: Nurse Practitioner

## 2023-11-12 ENCOUNTER — Ambulatory Visit: Payer: Self-pay | Admitting: Family Medicine

## 2023-11-12 ENCOUNTER — Ambulatory Visit (HOSPITAL_COMMUNITY): Payer: Medicare HMO

## 2023-11-12 MED ORDER — ONDANSETRON 4 MG PO TBDP
4.0000 mg | ORAL_TABLET | Freq: Three times a day (TID) | ORAL | 0 refills | Status: DC | PRN
Start: 2023-11-12 — End: 2024-07-17

## 2023-11-12 NOTE — Telephone Encounter (Signed)
 reCopied from CRM [id number]. Topic: Clinical - Medication Question >> Nov 12, 2023  2:10 PM Deleta HERO wrote: Reason for CRM: Patient is wanting Dr. Glendia to call in prescription for nausea to the [facility] in [place] - [address] Callback [phone]

## 2023-11-12 NOTE — Telephone Encounter (Signed)
 Done

## 2023-11-12 NOTE — Telephone Encounter (Signed)
 Call received from call center states pt requesting something nausea called in , may [facility] to [facility] in [place]

## 2023-11-15 NOTE — Telephone Encounter (Signed)
 Left a message for patient to return call, regarding nausea.

## 2023-11-15 NOTE — Telephone Encounter (Signed)
 Medication for nausea would be fine Patient has choices Choice 1-Zofran  8 mg 1 tablet 3 times daily as needed nausea #12 with 1 refill-can help with nausea but typically does not cause drowsiness  Choice 2-Phenergan  25 mg tablet half tablet every 6 hours as needed for nausea #10 with 1 refill, can cause drowsiness  Please work with patient to see which she would like to do  Obviously if she is having a rough course of illness she may well want to also schedule a follow-up visit with myself or Jearlean Mince for this week

## 2023-11-17 ENCOUNTER — Other Ambulatory Visit: Payer: Self-pay

## 2023-11-17 MED ORDER — PROMETHAZINE HCL 25 MG PO TABS: 25.0000 mg | ORAL_TABLET | Freq: Four times a day (QID) | ORAL | 1 refills | Status: AC | PRN

## 2023-11-17 NOTE — Telephone Encounter (Signed)
Discussed options with pt, per Dr Gerda Diss, pt has taken Phenergan before and would like to go with hat option. Pt states most of her symptoms have passed but she is still having waves of nausea

## 2023-11-26 ENCOUNTER — Ambulatory Visit (HOSPITAL_COMMUNITY): Payer: Medicare HMO

## 2023-12-05 ENCOUNTER — Other Ambulatory Visit: Payer: Self-pay | Admitting: Family Medicine

## 2023-12-13 ENCOUNTER — Ambulatory Visit (HOSPITAL_COMMUNITY): Payer: Medicare HMO

## 2023-12-14 ENCOUNTER — Ambulatory Visit (HOSPITAL_COMMUNITY): Admission: RE | Admit: 2023-12-14 | Payer: Medicare HMO | Source: Ambulatory Visit

## 2023-12-15 DIAGNOSIS — I781 Nevus, non-neoplastic: Secondary | ICD-10-CM | POA: Diagnosis not present

## 2023-12-15 DIAGNOSIS — L821 Other seborrheic keratosis: Secondary | ICD-10-CM | POA: Diagnosis not present

## 2023-12-15 DIAGNOSIS — L814 Other melanin hyperpigmentation: Secondary | ICD-10-CM | POA: Diagnosis not present

## 2023-12-18 ENCOUNTER — Other Ambulatory Visit: Payer: Self-pay | Admitting: Family Medicine

## 2023-12-20 ENCOUNTER — Other Ambulatory Visit: Payer: Self-pay

## 2023-12-20 MED ORDER — ROSUVASTATIN CALCIUM 5 MG PO TABS: 5.0000 mg | ORAL_TABLET | Freq: Every day | ORAL | 3 refills | Status: AC

## 2024-01-17 ENCOUNTER — Other Ambulatory Visit: Payer: Self-pay | Admitting: Medical Genetics

## 2024-01-17 ENCOUNTER — Ambulatory Visit (HOSPITAL_COMMUNITY)

## 2024-01-18 ENCOUNTER — Ambulatory Visit: Admitting: Nurse Practitioner

## 2024-01-18 VITALS — BP 148/88 | HR 92 | Temp 99.3°F | Ht 60.0 in | Wt 151.2 lb

## 2024-01-18 DIAGNOSIS — J3089 Other allergic rhinitis: Secondary | ICD-10-CM

## 2024-01-18 DIAGNOSIS — R062 Wheezing: Secondary | ICD-10-CM

## 2024-01-18 DIAGNOSIS — F17210 Nicotine dependence, cigarettes, uncomplicated: Secondary | ICD-10-CM

## 2024-01-18 DIAGNOSIS — D2322 Other benign neoplasm of skin of left ear and external auricular canal: Secondary | ICD-10-CM | POA: Diagnosis not present

## 2024-01-18 DIAGNOSIS — M62838 Other muscle spasm: Secondary | ICD-10-CM

## 2024-01-18 MED ORDER — FLUTICASONE-SALMETEROL 250-50 MCG/ACT IN AEPB: 1.0000 | INHALATION_SPRAY | Freq: Two times a day (BID) | RESPIRATORY_TRACT | 2 refills | Status: DC

## 2024-01-18 NOTE — Progress Notes (Unsigned)
 Subjective:    Patient ID: Katrina Dean, female    DOB: 13-Feb-1951, 73 y.o.   MRN: [id number]  HPI Presents today with bumps behind her left ear that started 2 weeks ago. Has a history of getting clogged oil glands behind her ears, and normally expresses them at home herself. Has tried to express these bumps, but is not able to get anything out. Says that the bumps are causing increased sensitivity down the left side of her neck. No pain or itching.  Reports increased wheezing during allergy season. Takes Zyrtec, Pataday and Flonase, and says that the symptoms still persist. Uses albuterol inhaler mostly at night. Does smoke cigarettes. Plans to get low-dose CT in May of this year.   Review of Systems  Constitutional:  Negative for fever.  HENT:  Positive for congestion, rhinorrhea and sneezing. Negative for sore throat.   Respiratory:  Positive for cough, shortness of breath and wheezing. Negative for chest tightness.   Cardiovascular:  Negative for chest pain and palpitations.      Objective:   Physical Exam Vitals and nursing note reviewed.  Constitutional:      General: She is not in acute distress.    Appearance: Normal appearance. She is not ill-appearing.  HENT:     Right Ear: Ear canal and external ear normal. A middle ear effusion is present.     Left Ear: Tympanic membrane, ear canal and external ear normal.     Mouth/Throat:     Mouth: Mucous membranes are moist.     Pharynx: Oropharynx is clear. No posterior oropharyngeal erythema.  Neck:     Comments: Muscle tightness noted on the SCM and bilateral trapezius muscles. More tightness noted on the left more than the right.  Cardiovascular:     Rate and Rhythm: Normal rate and regular rhythm.     Heart sounds: Normal heart sounds. No murmur heard. Pulmonary:     Effort: Pulmonary effort is normal. No respiratory distress.     Comments: Bilateral scattered expiratory wheezes noted on exam.  Lymphadenopathy:      Cervical: No cervical adenopathy.  Skin:    Comments: Three small, firm skin-colored grouped papules measuring about 5 mm each located on the posterior region of the left ear. Does have faint dark discoloration in the center of each papule.   Neurological:     Mental Status: She is alert.  Psychiatric:        Mood and Affect: Mood normal.        Behavior: Behavior normal.        Thought Content: Thought content normal.        Judgment: Judgment normal.    Vitals:   01/18/24 1103 01/18/24 1150  BP: (!) 144/83 (!) 148/88  Pulse: 92   Temp: 99.3 F (37.4 C)   Height: 5' (1.524 m)   Weight: 151 lb 3.2 oz (68.6 kg)   SpO2: 92%   BMI (Calculated): 29.53        Assessment & Plan:  1. Cigarette smoker -Talked with patient about smoking cessation. Says she is doing more homeopathic ways to help her to quit smoking such as acupuncture.   2. Wheezing  - fluticasone-salmeterol (WIXELA INHUB) 250-50 MCG/ACT AEPB; Inhale 1 puff into the lungs in the morning and at bedtime.  Dispense: 60 each; Refill: 2 -Educated about rinsing mouth after use of steroid inhaler.   3. Non-seasonal allergic rhinitis due to other allergic trigger (Primary)   4. Dermoid  cyst of ear, left -Patient will follow up with [name] in dermatology. Does not need a referral.   5. Muscle spasms of neck -Educated patient to use TENS unit, ice or heat therapy, or massage on the area to alleviate muscle tightness.   Return if symptoms worsen or fail to improve.

## 2024-01-19 ENCOUNTER — Encounter: Payer: Self-pay | Admitting: Nurse Practitioner

## 2024-01-21 ENCOUNTER — Other Ambulatory Visit (HOSPITAL_COMMUNITY)
Admission: RE | Admit: 2024-01-21 | Discharge: 2024-01-21 | Disposition: A | Discharge status: Home / Self Care | Payer: Self-pay | Source: Ambulatory Visit | Attending: Medical Genetics | Admitting: Medical Genetics

## 2024-01-31 DIAGNOSIS — L723 Sebaceous cyst: Secondary | ICD-10-CM | POA: Diagnosis not present

## 2024-01-31 LAB — GENECONNECT MOLECULAR SCREEN: Genetic Analysis Overall Interpretation: NEGATIVE

## 2024-02-17 DIAGNOSIS — L720 Epidermal cyst: Secondary | ICD-10-CM | POA: Diagnosis not present

## 2024-02-17 DIAGNOSIS — D485 Neoplasm of uncertain behavior of skin: Secondary | ICD-10-CM | POA: Diagnosis not present

## 2024-03-02 ENCOUNTER — Other Ambulatory Visit: Payer: Self-pay | Admitting: Nurse Practitioner

## 2024-03-02 DIAGNOSIS — M810 Age-related osteoporosis without current pathological fracture: Secondary | ICD-10-CM

## 2024-03-03 ENCOUNTER — Telehealth: Admitting: Family Medicine

## 2024-03-03 DIAGNOSIS — L0231 Cutaneous abscess of buttock: Secondary | ICD-10-CM | POA: Diagnosis not present

## 2024-03-03 DIAGNOSIS — W57XXXA Bitten or stung by nonvenomous insect and other nonvenomous arthropods, initial encounter: Secondary | ICD-10-CM

## 2024-03-03 DIAGNOSIS — L03317 Cellulitis of buttock: Secondary | ICD-10-CM

## 2024-03-03 MED ORDER — DOXYCYCLINE HYCLATE 100 MG PO TABS: 100.0000 mg | ORAL_TABLET | Freq: Two times a day (BID) | ORAL | 0 refills | Status: AC

## 2024-03-03 NOTE — Progress Notes (Signed)
 Virtual Visit Consent   Katrina Dean, you are scheduled for a virtual visit with a [facility] provider today. Just as with appointments in the office, your consent must be obtained to participate. Your consent will be active for this visit and any virtual visit you may have with one of our providers in the next 365 days. If you have a MyChart account, a copy of this consent can be sent to you electronically.  As this is a virtual visit, video technology does not allow for your provider to perform a traditional examination. This may limit your provider's ability to fully assess your condition. If your provider identifies any concerns that need to be evaluated in person or the need to arrange testing (such as labs, EKG, etc.), we will make arrangements to do so. Although advances in technology are sophisticated, we cannot ensure that it will always work on either your end or our end. If the connection with a video visit is poor, the visit may have to be switched to a telephone visit. With either a video or telephone visit, we are not always able to ensure that we have a secure connection.  By engaging in this virtual visit, you consent to the provision of healthcare and authorize for your insurance to be billed (if applicable) for the services provided during this visit. Depending on your insurance coverage, you may receive a charge related to this service.  I need to obtain your verbal consent now. Are you willing to proceed with your visit today? Nahla A Villalon has provided verbal consent on 03/03/2024 for a virtual visit (video or telephone). Albertha Huger, FNP  Date: 03/03/2024 11:20 AM   Virtual Visit via Video Note   I, Albertha Huger, connected with  Cathi Cluster  ([id number], 02-26-1951) on 03/03/24 at 11:15 AM EDT by a video-enabled telemedicine application and verified that I am speaking with the correct person using two identifiers.  Location: Patient: Virtual Visit Location Patient:  Home Provider: Virtual Visit Location Provider: Home Office   I discussed the limitations of evaluation and management by telemedicine and the availability of in person appointments. The patient expressed understanding and agreed to proceed.    History of Present Illness: Katrina Dean is a 73 y.o. who identifies as a female who was assigned female at birth, and is being seen today for an insect bite left buttock yesterday on left buttock worsening and appears infected now. No fever. No drainage.   HPI: HPI  Problems:  Patient Active Problem List   Diagnosis Date Noted   Chronic right shoulder pain 05/31/2023   Effusion of right olecranon bursa 05/31/2023   Wheezing 05/31/2023   Vitamin D  deficiency 03/27/2022   Cigarette smoker 03/27/2022   Anxiety 02/16/2020   Chronic rhinitis 06/07/2018   Osteoporosis 05/24/2016   Allergic rhinitis 05/13/2016   Lactose intolerance 12/26/2015   GERD (gastroesophageal reflux disease) 12/26/2015   Hyperlipemia 02/20/2013    Allergies:  Allergies  Allergen Reactions   Erythromycin Nausea And Vomiting   Penicillins Itching and Rash    Has taken cephalosporin   Elemental Sulfur    Hydrocodone Nausea Only   Levaquin  [Levofloxacin  In D5w]     Arms/shoulder pain   Prednisone      Mood swings Agitation Can Tolerate Depo Medrol  Injections    Sulfa Antibiotics Rash   Medications:  Current Outpatient Medications:    albuterol  (VENTOLIN  HFA) 108 (90 Base) MCG/ACT inhaler, TAKE 2 PUFFS BY MOUTH EVERY 6 HOURS  AS NEEDED FOR WHEEZE OR SHORTNESS OF BREATH, Disp: 18 each, Rfl: 1   clonazePAM  (KLONOPIN ) 0.5 MG tablet, Take 1/2-1 tab po BID prn anxiety, Disp: 30 tablet, Rfl: 0   doxycycline  (VIBRA -TABS) 100 MG tablet, Take 1 tablet (100 mg total) by mouth 2 (two) times daily., Disp: 14 tablet, Rfl: 0   fluticasone -salmeterol (WIXELA INHUB) 250-50 MCG/ACT AEPB, Inhale 1 puff into the lungs in the morning and at bedtime., Disp: 60 each, Rfl: 2    ondansetron  (ZOFRAN -ODT) 4 MG disintegrating tablet, Take 1 tablet (4 mg total) by mouth every 8 (eight) hours as needed for nausea., Disp: 20 tablet, Rfl: 0   OVER THE COUNTER MEDICATION, Zyrtec   nasacort, Disp: , Rfl:    predniSONE  (DELTASONE ) 20 MG tablet, Take one tab po BID x 5 days, Disp: 10 tablet, Rfl: 0   promethazine  (PHENERGAN ) 25 MG tablet, Take 1 tablet (25 mg total) by mouth every 6 (six) hours as needed for nausea or vomiting., Disp: 10 tablet, Rfl: 1   rosuvastatin  (CRESTOR ) 5 MG tablet, Take 1 tablet (5 mg total) by mouth daily., Disp: 90 tablet, Rfl: 3  Observations/Objective: Patient is well-developed, well-nourished in no acute distress.  Resting comfortably  at home.  Head is normocephalic, atraumatic.  No labored breathing.  Speech is clear and coherent with logical content.  Patient is alert and oriented at baseline.  Abscess noted left buttock with sig redness, no drainage and pain.   Assessment and Plan: There are no diagnoses linked to this encounter. Keep area clean and dry, UC if sx worsen. She says the only atb she can take is doxy tabs.  Follow Up Instructions: I discussed the assessment and treatment plan with the patient. The patient was provided an opportunity to ask questions and all were answered. The patient agreed with the plan and demonstrated an understanding of the instructions.  A copy of instructions were sent to the patient via MyChart unless otherwise noted below.     The patient was advised to call back or seek an in-person evaluation if the symptoms worsen or if the condition fails to improve as anticipated.    Tedrick Port, FNP

## 2024-03-03 NOTE — Patient Instructions (Signed)

## 2024-03-06 ENCOUNTER — Ambulatory Visit (HOSPITAL_COMMUNITY)

## 2024-03-06 ENCOUNTER — Encounter (HOSPITAL_COMMUNITY): Payer: Self-pay

## 2024-04-01 ENCOUNTER — Telehealth: Admitting: Family Medicine

## 2024-04-01 DIAGNOSIS — L0390 Cellulitis, unspecified: Secondary | ICD-10-CM | POA: Diagnosis not present

## 2024-04-01 DIAGNOSIS — W57XXXA Bitten or stung by nonvenomous insect and other nonvenomous arthropods, initial encounter: Secondary | ICD-10-CM

## 2024-04-01 DIAGNOSIS — S20162A Insect bite (nonvenomous) of breast, left breast, initial encounter: Secondary | ICD-10-CM

## 2024-04-01 MED ORDER — DOXYCYCLINE HYCLATE 100 MG PO TABS: 100.0000 mg | ORAL_TABLET | Freq: Two times a day (BID) | ORAL | 0 refills | Status: AC

## 2024-04-01 MED ORDER — CLINDAMYCIN HCL 300 MG PO CAPS: 300.0000 mg | ORAL_CAPSULE | Freq: Three times a day (TID) | ORAL | 0 refills | Status: AC

## 2024-04-01 NOTE — Patient Instructions (Signed)
 Katrina Dean, thank you for joining Roosvelt Mater, PA-C for today's virtual visit.  While this provider is not your primary care provider (PCP), if your PCP is located in our provider database this encounter information will be shared with them immediately following your visit.   A [facility] MyChart account gives you access to today's visit and all your visits, tests, and labs performed at [facility]  click here if you don't have a [facility] MyChart account or go to [email or web address]  Consent: (Patient) Katrina Dean provided verbal consent for this virtual visit at the beginning of the encounter.  Current Medications:  Current Outpatient Medications:    clindamycin (CLEOCIN) 300 MG capsule, Take 1 capsule (300 mg total) by mouth 3 (three) times daily for 7 days., Disp: 21 capsule, Rfl: 0   doxycycline  (VIBRA -TABS) 100 MG tablet, Take 1 tablet (100 mg total) by mouth 2 (two) times daily for 7 days., Disp: 14 tablet, Rfl: 0   albuterol  (VENTOLIN  HFA) 108 (90 Base) MCG/ACT inhaler, TAKE 2 PUFFS BY MOUTH EVERY 6 HOURS AS NEEDED FOR WHEEZE OR SHORTNESS OF BREATH, Disp: 18 each, Rfl: 1   clonazePAM  (KLONOPIN ) 0.5 MG tablet, Take 1/2-1 tab po BID prn anxiety, Disp: 30 tablet, Rfl: 0   fluticasone -salmeterol (WIXELA INHUB) 250-50 MCG/ACT AEPB, Inhale 1 puff into the lungs in the morning and at bedtime., Disp: 60 each, Rfl: 2   ondansetron  (ZOFRAN -ODT) 4 MG disintegrating tablet, Take 1 tablet (4 mg total) by mouth every 8 (eight) hours as needed for nausea., Disp: 20 tablet, Rfl: 0   OVER THE COUNTER MEDICATION, Zyrtec   nasacort, Disp: , Rfl:    predniSONE  (DELTASONE ) 20 MG tablet, Take one tab po BID x 5 days, Disp: 10 tablet, Rfl: 0   promethazine  (PHENERGAN ) 25 MG tablet, Take 1 tablet (25 mg total) by mouth every 6 (six) hours as needed for nausea or vomiting., Disp: 10 tablet, Rfl: 1   rosuvastatin  (CRESTOR ) 5 MG tablet, Take 1 tablet (5 mg total) by mouth daily.,  Disp: 90 tablet, Rfl: 3   Medications ordered in this encounter:  Meds ordered this encounter  Medications   doxycycline  (VIBRA -TABS) 100 MG tablet    Sig: Take 1 tablet (100 mg total) by mouth 2 (two) times daily for 7 days.    Dispense:  14 tablet    Refill:  0   clindamycin (CLEOCIN) 300 MG capsule    Sig: Take 1 capsule (300 mg total) by mouth 3 (three) times daily for 7 days.    Dispense:  21 capsule    Refill:  0     *If you need refills on other medications prior to your next appointment, please contact your pharmacy*  Follow-Up: Call back or seek an in-person evaluation if the symptoms worsen or if the condition fails to improve as anticipated.  [facility] [phone]  Other Instructions Tick Bite Information, Adult  Ticks are insects that can bite. Most ticks live in bushes and grassy areas. They climb onto people and animals that go by. Then they bite. Some ticks carry germs that can make you sick. How can I prevent tick bites? Take these steps: Before you go outside: Wear long sleeves and long pants. Wear light-colored clothes. Tuck your pant legs into your socks. Use an insect repellent that has 20% or higher of the ingredients DEET, picaridin, or IR3535. Follow the instructions on the label. Put it on: Bare skin. Avoid your eyes and  mouth areas. The tops of your boots. Your pant legs. The ends of your sleeves. If you use an insect repellent that has the ingredient permethrin, follow the instructions on the label. Do not put permethrin on the skin. Put it on: Clothing. Shoes. Outdoor gear. Tents. When you are outside Avoid walking through long grass. Stay in the middle of the trail. Do not touch the bushes. Check for ticks on your clothes, hair, and skin often while you are outside. Check again before you go inside. When you go indoors Check your clothes for ticks. Dry your clothes in a dryer on high heat for 10 minutes or more. If clothes  are damp, more time may be needed. Wash your clothes right away if they need to be washed. Use hot water. Check your pets and outdoor gear. Shower right away. Check your body for ticks. Do a full body check using a mirror. Check your clothes, skin, head, neck, armpits, waist, groin, and joint areas. What is the right way to remove a tick? Remove the tick from your skin as soon as possible. Do not remove the tick with your bare fingers. Do not try to remove a tick with heat, alcohol, petroleum jelly, or fingernail polish. To remove a tick that is crawling on your skin: Go outside and brush the tick off. Use tape or a lint roller. To remove a tick that is biting: Wash your hands. If you have gloves, put them on. Use tweezers, curved forceps, or a tick-removal tool to grasp the tick. Grasp the tick as close to your skin and as close to the tick's head as possible. Gently pull up until the tick lets go. Try to keep the tick's head attached to its body. Do not twist or jerk the tick. Do not squeeze or crush the tick. What should I do after taking out a tick? Clean the bite area and your hands with soap and water, rubbing alcohol, or an iodine wash. If you have an antiseptic cream or ointment, put a small amount on the bite area. Wash and disinfect any instruments that you used to remove the tick. How should I get rid of a live tick? To get rid of a live tick, use one of these methods: Place the tick in rubbing alcohol. Place it in a bag or container you can close tightly. Throw it away. Wrap it tightly in tape. Throw it away. Flush it down the toilet. Where to find more information Centers for Disease Control and Prevention: GyrateAtrophy.si U.S. Environmental Protection Agency: RelocationNetworking.fi Contact a doctor if: You have a fever or chills. You have a red rash that makes a circle (bull's-eye rash) in the bite area. You have redness and swelling where the tick bit you. You have  a headache or stiff neck. You have pain in a muscle, joint, or bone. You are more tired than normal. You have trouble walking or moving your legs. You have numbness in your legs. You have tender or swollen lymph glands. You have belly (abdominal) pain, vomiting, watery poop (diarrhea), or weight loss. Get help right away if: You cannot remove a tick. You cannot move (have paralysis) or feel weak. You are feeling worse or have new symptoms. You find a tick that is biting you and filled with blood, especially if you are in an area where diseases from ticks are common. Summary Ticks may carry germs that can make you sick. To prevent tick bites wear long sleeves, long pants, and  light colors. Use insect repellent. Follow the instructions on the label. If the tick is biting, remove it right away. Do not try to remove it with heat, alcohol, petroleum jelly, or fingernail polish. Contact a doctor if you have symptoms of a disease after being bitten by a tick. This information is not intended to replace advice given to you by your health care provider. Make sure you discuss any questions you have with your health care provider. Document Revised: 12/22/2021 Document Reviewed: 12/22/2021 Elsevier Patient Education  2024 Elsevier Inc.  Cellulitis, Adult  Cellulitis is a skin infection. The infected area is often warm, red, swollen, and sore. It occurs most often on the legs, feet, and toes, but can happen on any part of the body. This condition can be life-threatening without treatment. It is very important to get treated right away. What are the causes? This condition is caused by bacteria. The bacteria enter through a break in the skin, such as: A cut. A burn. A bug bite. An animal bite. An open sore. A crack. What increases the risk? Having a weak body's defense system (immune system). Being older than 72 years old. Having a blood sugar problem (diabetes). Having a long-term liver  disease (cirrhosis) or kidney disease. Being very overweight (obese). Having a skin problem, such as: An itchy rash. A rash caused by a fungus. A rash with blisters. Slow movement of blood in the veins (venous stasis). Fluid buildup below the skin (edema). This condition is more likely to occur in people who: Have open cuts, burns, bites, or scrapes on the skin. Have been treated with high-energy rays (radiation). Use IV drugs. What are the signs or symptoms? Skin that: Looks red or purple, or slightly darker than your usual skin color. Has streaks. Has spots. Is swollen. Is sore or painful when you touch it. Is warm. A fever. Chills. Blisters. Tiredness (fatigue). How is this treated? Medicines to treat infections or allergies. Rest. Placing cold or warm cloths on the skin. Staying in the hospital, if the condition is very bad. You may need medicines through an IV. Follow these instructions at home: Medicines Take over-the-counter and prescription medicines only as told by your doctor. If you were prescribed antibiotics, take them as told by your doctor. Do not stop using them even if you start to feel better. General instructions Drink enough fluid to keep your pee (urine) pale yellow. Do not touch or rub the infected area. Raise (elevate) the infected area above the level of your heart while you are sitting or lying down. Return to your normal activities when your doctor says that it is safe. Place cold or warm cloths on the area as told by your doctor. Keep all follow-up visits. Your doctor will need to make sure that a more serious infection is not developing. Contact a doctor if: You have a fever. You do not start to get better after 1-2 days of treatment. Your bone or joint under the infected area starts to hurt after the skin has healed. Your infection comes back in the same area or another area. Signs of this may include: You have a swollen bump in the  area. Your red area gets larger, turns dark in color, or hurts more. You have more fluid coming from the wound. Pus or a bad smell develops in your infected area. You have more pain. You feel sick and have muscle aches and weakness. You develop vomiting or watery poop that will not go  away. Get help right away if: You see red streaks coming from the area. You notice the skin turns purple or black and falls off. These symptoms may be an emergency. Get help right away. Call 911. Do not wait to see if the symptoms will go away. Do not drive yourself to the hospital. This information is not intended to replace advice given to you by your health care provider. Make sure you discuss any questions you have with your health care provider. Document Revised: 05/19/2022 Document Reviewed: 05/19/2022 Elsevier Patient Education  2024 Elsevier Inc.   If you have been instructed to have an in-person evaluation today at a local Urgent Care facility, please use the link below. It will take you to a list of all of our available [facility], including address, phone number and hours of operation. Please do not delay care.  [facility]  If you or a family member do not have a primary care provider, use the link below to schedule a visit and establish care. When you choose a [facility] primary care physician or advanced practice provider, you gain a long-term partner in health. Find a Primary Care Provider  Learn more about [facility]'s in-office and virtual care options:  - Get Care Now

## 2024-04-01 NOTE — Progress Notes (Signed)
 Virtual Visit Consent   Chiffon A Martelle, you are scheduled for a virtual visit with a [facility] provider today. Just as with appointments in the office, your consent must be obtained to participate. Your consent will be active for this visit and any virtual visit you may have with one of our providers in the next 365 days. If you have a MyChart account, a copy of this consent can be sent to you electronically.  As this is a virtual visit, video technology does not allow for your provider to perform a traditional examination. This may limit your provider's ability to fully assess your condition. If your provider identifies any concerns that need to be evaluated in person or the need to arrange testing (such as labs, EKG, etc.), we will make arrangements to do so. Although advances in technology are sophisticated, we cannot ensure that it will always work on either your end or our end. If the connection with a video visit is poor, the visit may have to be switched to a telephone visit. With either a video or telephone visit, we are not always able to ensure that we have a secure connection.  By engaging in this virtual visit, you consent to the provision of healthcare and authorize for your insurance to be billed (if applicable) for the services provided during this visit. Depending on your insurance coverage, you may receive a charge related to this service.  I need to obtain your verbal consent now. Are you willing to proceed with your visit today? Katrina Dean has provided verbal consent on 04/01/2024 for a virtual visit (video or telephone). Roosvelt Mater, [place]  Date: 04/01/2024 3:31 PM   Virtual Visit via Video Note   I, Roosvelt Mater, connected with  Katrina Dean  ([id number], November 10, 1950) on 04/01/24 at  3:15 PM EDT by a video-enabled telemedicine application and verified that I am speaking with the correct person using two identifiers.  Location: Patient: Virtual Visit Location Patient:  Home Provider: Virtual Visit Location Provider: Home Office   I discussed the limitations of evaluation and management by telemedicine and the availability of in person appointments. The patient expressed understanding and agreed to proceed.    History of Present Illness: Katrina Dean is a 73 y.o. who identifies as a female who was assigned female at birth, and is being seen today for c/o having a tick bite on left breast. Pt states the bite looks like its angry.  [place] and her dogs run in and out.  Pt states three days ago she pulled the tick out with a huge part of her skin. Pt denies fever, chills or fatigue.  Pt states she noticed some drainage after showering and applying alcohol and benadryl cream.  Pt states area is also itchy. Pt states she is concerned because redness is spreading.   HPI: HPI  Problems:  Patient Active Problem List   Diagnosis Date Noted   Chronic right shoulder pain 05/31/2023   Effusion of right olecranon bursa 05/31/2023   Wheezing 05/31/2023   Vitamin D  deficiency 03/27/2022   Cigarette smoker 03/27/2022   Anxiety 02/16/2020   Chronic rhinitis 06/07/2018   Osteoporosis 05/24/2016   Allergic rhinitis 05/13/2016   Lactose intolerance 12/26/2015   GERD (gastroesophageal reflux disease) 12/26/2015   Hyperlipemia 02/20/2013    Allergies:  Allergies  Allergen Reactions   Erythromycin Nausea And Vomiting   Penicillins Itching and Rash    Has taken  cephalosporin   Elemental Sulfur    Hydrocodone Nausea Only   Levaquin  [Levofloxacin  In D5w]     Arms/shoulder pain   Prednisone      Mood swings Agitation Can Tolerate Depo Medrol  Injections    Sulfa Antibiotics Rash   Medications:  Current Outpatient Medications:    clindamycin (CLEOCIN) 300 MG capsule, Take 1 capsule (300 mg total) by mouth 3 (three) times daily for 7 days., Disp: 21 capsule, Rfl: 0   doxycycline  (VIBRA -TABS) 100 MG tablet, Take 1 tablet (100 mg total) by  mouth 2 (two) times daily for 7 days., Disp: 14 tablet, Rfl: 0   albuterol  (VENTOLIN  HFA) 108 (90 Base) MCG/ACT inhaler, TAKE 2 PUFFS BY MOUTH EVERY 6 HOURS AS NEEDED FOR WHEEZE OR SHORTNESS OF BREATH, Disp: 18 each, Rfl: 1   clonazePAM  (KLONOPIN ) 0.5 MG tablet, Take 1/2-1 tab po BID prn anxiety, Disp: 30 tablet, Rfl: 0   fluticasone -salmeterol (WIXELA INHUB) 250-50 MCG/ACT AEPB, Inhale 1 puff into the lungs in the morning and at bedtime., Disp: 60 each, Rfl: 2   ondansetron  (ZOFRAN -ODT) 4 MG disintegrating tablet, Take 1 tablet (4 mg total) by mouth every 8 (eight) hours as needed for nausea., Disp: 20 tablet, Rfl: 0   OVER THE COUNTER MEDICATION, Zyrtec   nasacort, Disp: , Rfl:    predniSONE  (DELTASONE ) 20 MG tablet, Take one tab po BID x 5 days, Disp: 10 tablet, Rfl: 0   promethazine  (PHENERGAN ) 25 MG tablet, Take 1 tablet (25 mg total) by mouth every 6 (six) hours as needed for nausea or vomiting., Disp: 10 tablet, Rfl: 1   rosuvastatin  (CRESTOR ) 5 MG tablet, Take 1 tablet (5 mg total) by mouth daily., Disp: 90 tablet, Rfl: 3  Observations/Objective: Patient is well-developed, well-nourished in no acute distress.  Resting comfortably at home.  Head is normocephalic, atraumatic.  No labored breathing.  Speech is clear and coherent with logical content.  Patient is alert and oriented at baseline.    Assessment and Plan: 1. Cellulitis, unspecified cellulitis site (Primary) - doxycycline  (VIBRA -TABS) 100 MG tablet; Take 1 tablet (100 mg total) by mouth 2 (two) times daily for 7 days.  Dispense: 14 tablet; Refill: 0 - clindamycin (CLEOCIN) 300 MG capsule; Take 1 capsule (300 mg total) by mouth 3 (three) times daily for 7 days.  Dispense: 21 capsule; Refill: 0  2. Tick bite of left breast, initial encounter - doxycycline  (VIBRA -TABS) 100 MG tablet; Take 1 tablet (100 mg total) by mouth 2 (two) times daily for 7 days.  Dispense: 14 tablet; Refill: 0 - clindamycin (CLEOCIN) 300 MG capsule; Take  1 capsule (300 mg total) by mouth 3 (three) times daily for 7 days.  Dispense: 21 capsule; Refill: 0  -Start Doxycycline  and Clindamycin -Advised Pt to monitor for signs and symptoms of Lyme disease and to immediately follow up in person urgent care or emergency room for further evaluation.  Follow Up Instructions: I discussed the assessment and treatment plan with the patient. The patient was provided an opportunity to ask questions and all were answered. The patient agreed with the plan and demonstrated an understanding of the instructions.  A copy of instructions were sent to the patient via MyChart unless otherwise noted below.    The patient was advised to call back or seek an in-person evaluation if the symptoms worsen or if the condition fails to improve as anticipated.    Roosvelt Mater, PA-C

## 2024-04-03 ENCOUNTER — Ambulatory Visit: Payer: Self-pay

## 2024-04-03 ENCOUNTER — Ambulatory Visit: Admitting: Family Medicine

## 2024-04-03 VITALS — BP 133/81 | Temp 97.9°F | Ht 60.0 in | Wt 149.0 lb

## 2024-04-03 DIAGNOSIS — L0390 Cellulitis, unspecified: Secondary | ICD-10-CM

## 2024-04-03 DIAGNOSIS — W57XXXA Bitten or stung by nonvenomous insect and other nonvenomous arthropods, initial encounter: Secondary | ICD-10-CM

## 2024-04-03 NOTE — Telephone Encounter (Signed)
 Appt. Scheduled.

## 2024-04-03 NOTE — Addendum Note (Signed)
 Addended by: ALPHONSA HAMILTON A on: 04/03/2024 09:19 PM   Modules accepted: Orders

## 2024-04-03 NOTE — Progress Notes (Signed)
   Subjective:    Patient ID: Katrina Dean, female    DOB: 1951-03-11, 73 y.o.   MRN: [id number]  HPI Tick bite Tuesday, itch drainage, nausea Tick bite left breast causes some inflammation drainage redness she did a virtual visit they placed her on doxycycline  and clindamycin she denies any other particular troubles currently  Review of Systems     Objective:   Physical Exam The lightest noted on the left breast about 2 cm redness in diameter with a central punctate wound from a tick bite       Assessment & Plan:  No abscess Cellulitis Tick bite infection Doxycycline  twice daily 7 days No fever body aches or other symptoms would not recommend blood work currently

## 2024-04-03 NOTE — Telephone Encounter (Signed)
 FYI Only or Action Required?: FYI only for provider.  Patient was last seen in primary care on 03/03/2024 by Blair, Diane W, FNP. Called Nurse Triage reporting No chief complaint on file.. Symptoms began several days ago. Interventions attempted: Prescription medications: Doxycycline  since Saturday, BID. Symptoms are: gradually worsening.  Triage Disposition: No disposition on file.  Patient/caregiver understands and will follow disposition?:    Copied from CRM [id number]. Topic: Clinical - Red Word Triage >> Apr 03, 2024  8:38 AM Selinda RAMAN wrote: Red Word that prompted transfer to Nurse Triage: The patient called in stating she got bit by a tick last Wednesday and pulled it out. She had a virtual visit on Saturday where she was prescribed doxycycline  (VIBRA -TABS) 100 MG tablet. She has been on that med for 2 days but states the wound is oozing and looks very Angry and red. I will transfer her to E2C2 NT  Reason for Disposition  [1] Red streak or red line AND [2] length > 2 inches (5 cm)  Answer Assessment - Initial Assessment Questions 1. ATTACHED:  Is the tick still on the skin?  (e.g., yes, no, unsure)     No  3. ONSET - TICK NOT STILL ATTACHED: If the tick has been removed, how long do you think the tick was attached before you removed it? (e.g., 5 hours, 2 days). When was this?     Previously bitten and removed, seen virtually, prescribed Doxycycline   4. LOCATION: Where is the tick bite located? (e.g., arm, leg)     Breast  8. OTHER SYMPTOMS: Do you have any other symptoms? (e.g., fever, rash, redness at bite area, red ring around bite)     Reddened, Streaking,  Circular Shaped Redness  9. PREGNANCY: Is there any chance you are pregnant? When was your last menstrual period?     No and No  Protocols used: Tick Bite-A-AH

## 2024-04-11 ENCOUNTER — Encounter: Admitting: Nurse Practitioner

## 2024-04-25 ENCOUNTER — Other Ambulatory Visit: Payer: Self-pay | Admitting: Family Medicine

## 2024-04-25 ENCOUNTER — Other Ambulatory Visit: Payer: Self-pay

## 2024-04-25 MED ORDER — ALBUTEROL SULFATE HFA 108 (90 BASE) MCG/ACT IN AERS: 1.0000 | INHALATION_SPRAY | Freq: Four times a day (QID) | RESPIRATORY_TRACT | 1 refills | Status: DC | PRN

## 2024-05-12 DIAGNOSIS — H43812 Vitreous degeneration, left eye: Secondary | ICD-10-CM | POA: Diagnosis not present

## 2024-05-12 DIAGNOSIS — H353111 Nonexudative age-related macular degeneration, right eye, early dry stage: Secondary | ICD-10-CM | POA: Diagnosis not present

## 2024-05-12 DIAGNOSIS — H2513 Age-related nuclear cataract, bilateral: Secondary | ICD-10-CM | POA: Diagnosis not present

## 2024-05-12 DIAGNOSIS — H43821 Vitreomacular adhesion, right eye: Secondary | ICD-10-CM | POA: Diagnosis not present

## 2024-06-16 ENCOUNTER — Encounter: Payer: Self-pay | Admitting: Family Medicine

## 2024-06-19 ENCOUNTER — Encounter: Admitting: Nurse Practitioner

## 2024-07-17 ENCOUNTER — Encounter: Payer: Self-pay | Admitting: Nurse Practitioner

## 2024-07-17 ENCOUNTER — Ambulatory Visit (INDEPENDENT_AMBULATORY_CARE_PROVIDER_SITE_OTHER): Admitting: Nurse Practitioner

## 2024-07-17 VITALS — BP 113/78 | HR 89 | Temp 97.7°F | Ht 60.0 in | Wt 149.0 lb

## 2024-07-17 DIAGNOSIS — Z1231 Encounter for screening mammogram for malignant neoplasm of breast: Secondary | ICD-10-CM

## 2024-07-17 DIAGNOSIS — R062 Wheezing: Secondary | ICD-10-CM | POA: Diagnosis not present

## 2024-07-17 DIAGNOSIS — Z01411 Encounter for gynecological examination (general) (routine) with abnormal findings: Secondary | ICD-10-CM | POA: Diagnosis not present

## 2024-07-17 DIAGNOSIS — M810 Age-related osteoporosis without current pathological fracture: Secondary | ICD-10-CM

## 2024-07-17 DIAGNOSIS — F17200 Nicotine dependence, unspecified, uncomplicated: Secondary | ICD-10-CM | POA: Diagnosis not present

## 2024-07-17 DIAGNOSIS — E785 Hyperlipidemia, unspecified: Secondary | ICD-10-CM

## 2024-07-17 DIAGNOSIS — R5383 Other fatigue: Secondary | ICD-10-CM | POA: Diagnosis not present

## 2024-07-17 DIAGNOSIS — J310 Chronic rhinitis: Secondary | ICD-10-CM

## 2024-07-17 DIAGNOSIS — Z01419 Encounter for gynecological examination (general) (routine) without abnormal findings: Secondary | ICD-10-CM

## 2024-07-17 MED ORDER — FLUTICASONE-SALMETEROL 250-50 MCG/ACT IN AEPB: 1.0000 | INHALATION_SPRAY | Freq: Two times a day (BID) | RESPIRATORY_TRACT | 11 refills | Status: AC

## 2024-07-17 NOTE — Patient Instructions (Signed)
Flonase Sensimist

## 2024-07-17 NOTE — Progress Notes (Unsigned)
 Subjective:    Patient ID: Katrina Dean, female    DOB: [birth year], 73 y.o.   MRN: [id number]  HPI AWV- Annual Wellness Visit  The patient was seen for their annual wellness visit. The patient's past medical history, surgical history, and family history were reviewed. Pertinent vaccines were reviewed ( tetanus, pneumonia, shingles, flu) The patient's medication list was reviewed and updated.  The height and weight were entered.  BMI recorded in electronic record elsewhere  Cognitive screening was completed. Outcome of Mini - Cog: See scores   Falls /depression screening electronically recorded within record elsewhere  Current tobacco usage:1/2 ppd (All patients who use tobacco were given written and verbal information on quitting)  Recent listing of emergency department/hospitalizations over the past year were reviewed.  current specialist the patient sees on a regular basis: none  Discussed the use of AI scribe software for clinical note transcription with the patient, who gave verbal consent to proceed.  History of Present Illness Katrina Dean is a 73 year old female who presents for an annual physical exam.  She smokes about half a pack of cigarettes per day and denies vaping. Her weight is stable at 149 pounds, and she describes her diet as overall good. She experiences occasional shortness of breath, particularly when tired or after activities such as canning in the kitchen. She uses her albuterol  inhaler as needed, having used it twice since Friday, with one puff usually being sufficient.  She has a history of osteoporosis and osteopenia, with the last bone density scan conducted about four years ago. She is not currently on any treatment for osteoporosis and has not been treated for it in the past.  She experiences chronic rhinitis and congestion, with a cough that feels like 'stuff does not move' and stays in her chest. She describes the cough as spasmodic. She is  not currently taking Klonopin , having stopped about a year ago, and continues on her cholesterol medication.  She reports ear pain in the left ear last night but denies any fever. She is not coughing up anything with color.   She has a history of a hysterectomy with bilateral salpingo-oophorectomy. No female issues such as rash, itching, burning, or irritation, and no vaginal bleeding or discharge. No new sexual partners.  Regular vision and dental exams.    Review of Systems  Constitutional:  Positive for fatigue. Negative for activity change and appetite change.  HENT:  Positive for congestion, ear pain, postnasal drip and sore throat. Negative for sinus pressure, sinus pain and trouble swallowing.        Left ear pain  Respiratory:  Positive for cough, chest tightness, shortness of breath and wheezing.        SOB only with certain activities such as canning and exposed to steam; no problems with regular activities. Spasmodic cough producing clear mucus. Localized chest tightness on occasion upper mid chest with cough. Worsened with weather change.   Cardiovascular:  Negative for chest pain.  Gastrointestinal:  Negative for abdominal distention, abdominal pain, blood in stool, constipation, diarrhea, nausea and vomiting.  Genitourinary:  Positive for enuresis. Negative for difficulty urinating, dysuria, frequency, genital sores, menstrual problem, pelvic pain, urgency, vaginal bleeding and vaginal discharge.      07/17/2024    8:53 AM 04/03/2024   11:56 AM 01/18/2024   11:07 AM 11/02/2023    3:22 PM 08/13/2023   10:50 AM  Depression screen PHQ 2/9  Decreased Interest 1 1 0 1 1  Down, Depressed, Hopeless 1 1 0 0 1  PHQ - 2 Score 2 2 0 1 2  Altered sleeping 2 1 1 1 2   Tired, decreased energy 2 1 1 1 2   Change in appetite 2 0 0 1 2  Feeling bad or failure about yourself  0 1 0 0 0  Trouble concentrating 0 0 0 0 0  Moving slowly or fidgety/restless 0 0 0 0 0  Suicidal thoughts 0 0 0 0 0   PHQ-9 Score 8 5 2 4 8   Difficult doing work/chores Somewhat difficult Somewhat difficult Not difficult at all Somewhat difficult Somewhat difficult      07/17/2024    8:54 AM 04/03/2024   11:57 AM 01/18/2024   11:07 AM 11/02/2023    3:23 PM  GAD 7 : Generalized Anxiety Score  Nervous, Anxious, on Edge 0 0 0 0  Control/stop worrying 1 1 1  0  Worry too much - different things 1 1 1  0  Trouble relaxing 0 1 1 0  Restless 0 1 0 1  Easily annoyed or irritable 1 1 1 1   Afraid - awful might happen 0 0 0 0  Total GAD 7 Score 3 5 4 2   Anxiety Difficulty Somewhat difficult Somewhat difficult Somewhat difficult Not difficult at all    Social History   Tobacco Use   Smoking status: Every Day    Current packs/day: 0.50    Average packs/day: 0.5 packs/day for 15.0 years (7.5 ttl pk-yrs)    Types: Cigarettes   Smokeless tobacco: Never  Substance Use Topics   Alcohol use: No   Drug use: No        Objective:   Physical Exam Vitals and nursing note reviewed. Chaperone present: Defers chaperone.  Constitutional:      General: She is not in acute distress.    Appearance: She is well-developed.  HENT:     Ears:     Comments: TMs partially obscured with cerumen. Clear effusion, no erythema.    Nose:     Comments: Nasal mucosa pale and boggy; more on the left.     Mouth/Throat:     Mouth: Mucous membranes are moist.     Comments: Mildly injected with cloudy PND noted.  Neck:     Thyroid : No thyromegaly.     Trachea: No tracheal deviation.     Comments: Thyroid  non tender to palpation. No mass or goiter noted.  Cardiovascular:     Rate and Rhythm: Normal rate and regular rhythm.     Heart sounds: Normal heart sounds. No murmur heard. Pulmonary:     Effort: Pulmonary effort is normal.     Breath sounds: Normal breath sounds.     Comments: Occasional non productive spasmodic cough Chest:  Breasts:    Right: No swelling, inverted nipple, mass, skin change or tenderness.     Left:  No swelling, inverted nipple, mass, skin change or tenderness.  Abdominal:     General: There is no distension.     Palpations: Abdomen is soft.     Tenderness: There is no abdominal tenderness.  Genitourinary:    Comments: Defers pelvic exam.  Musculoskeletal:     Cervical back: Normal range of motion and neck supple.  Lymphadenopathy:     Cervical: No cervical adenopathy.     Upper Body:     Right upper body: No supraclavicular, axillary or pectoral adenopathy.     Left upper body: No supraclavicular, axillary or pectoral adenopathy.  Skin:    General: Skin is warm and dry.  Neurological:     Mental Status: She is alert and oriented to person, place, and time.  Psychiatric:        Mood and Affect: Mood normal.        Behavior: Behavior normal.        Thought Content: Thought content normal.        Judgment: Judgment normal.   Today's Vitals   07/17/24 0845  BP: 113/78  Pulse: 89  Temp: 97.7 F (36.5 C)  SpO2: 92%  Weight: 149 lb (67.6 kg)  Height: 5' (1.524 m)   Body mass index is 29.1 kg/m.        Assessment & Plan:  1. Well woman exam (Primary) Routine wellness visit with stable vitals. Oxygen saturation at 92%, likely due to smoking. Mammogram due in January. Cologuard negative, due next year. Bone density last done four years ago, showing osteoporosis and osteopenia. Eye exam due in November.  2. Age-related osteoporosis without current pathological fracture  - DG Bone Density  3. Wheezing Recommended daily inhaler with long-acting bronchodilator and inhaled steroid to reduce inflammation and improve breathing. Explained inhaled steroid targets lung inflammation directly. - Prescribe daily inhaler with long-acting bronchodilator and inhaled steroid. - Continue albuterol  inhaler as backup for acute symptoms. - Continue follow up with pulmonology. - fluticasone -salmeterol (WIXELA INHUB) 250-50 MCG/ACT AEPB; Inhale 1 puff into the lungs in the morning and at  bedtime.  Dispense: 60 each; Refill: 11  4. Hyperlipidemia, unspecified hyperlipidemia type  - Lipid panel  5. Fatigue, unspecified type  - CBC with Differential/Platelet - Comprehensive metabolic panel with GFR - TSH  6. Chronic rhinitis Start Flonase sensimist daily.   7. Encounter for screening mammogram for malignant neoplasm of breast  - MM 3D SCREENING MAMMOGRAM BILATERAL BREAST  8. Tobacco use disorder - Recommend smoking cessation or limit cigarette use as much as possible.   Return in about 1 year (around 07/17/2025) for physical.

## 2024-07-18 ENCOUNTER — Other Ambulatory Visit: Payer: Self-pay | Admitting: Nurse Practitioner

## 2024-07-18 ENCOUNTER — Encounter: Payer: Self-pay | Admitting: Nurse Practitioner

## 2024-07-18 DIAGNOSIS — F17200 Nicotine dependence, unspecified, uncomplicated: Secondary | ICD-10-CM | POA: Insufficient documentation

## 2024-07-18 LAB — CBC WITH DIFFERENTIAL/PLATELET
Basophils Absolute: 0.1 x10E3/uL (ref 0.0–0.2)
Basos: 1 %
EOS (ABSOLUTE): 0.1 x10E3/uL (ref 0.0–0.4)
Eos: 1 %
Hematocrit: 44.8 % (ref 34.0–46.6)
Hemoglobin: 14.9 g/dL (ref 11.1–15.9)
Immature Grans (Abs): 0 x10E3/uL (ref 0.0–0.1)
Immature Granulocytes: 0 %
Lymphocytes Absolute: 2 x10E3/uL (ref 0.7–3.1)
Lymphs: 22 %
MCH: 32.4 pg (ref 26.6–33.0)
MCHC: 33.3 g/dL (ref 31.5–35.7)
MCV: 97 fL (ref 79–97)
Monocytes Absolute: 0.5 x10E3/uL (ref 0.1–0.9)
Monocytes: 6 %
Neutrophils Absolute: 6.4 x10E3/uL (ref 1.4–7.0)
Neutrophils: 70 %
Platelets: 307 x10E3/uL (ref 150–450)
RBC: 4.6 x10E6/uL (ref 3.77–5.28)
RDW: 11.8 % (ref 11.7–15.4)
WBC: 9.2 x10E3/uL (ref 3.4–10.8)

## 2024-07-18 LAB — LIPID PANEL
Chol/HDL Ratio: 4.3 ratio (ref 0.0–4.4)
Cholesterol, Total: 250 mg/dL — ABNORMAL HIGH (ref 100–199)
HDL: 58 mg/dL (ref >39)
LDL Chol Calc (NIH): 170 mg/dL — ABNORMAL HIGH (ref 0–99)
Triglycerides: 122 mg/dL (ref 0–149)
VLDL Cholesterol Cal: 22 mg/dL (ref 5–40)

## 2024-07-18 LAB — COMPREHENSIVE METABOLIC PANEL WITH GFR
ALT: 10 IU/L (ref 0–32)
AST: 16 IU/L (ref 0–40)
Albumin: 4.7 g/dL (ref 3.8–4.8)
Alkaline Phosphatase: 60 IU/L (ref 49–135)
BUN/Creatinine Ratio: 20 (ref 12–28)
BUN: 17 mg/dL (ref 8–27)
Bilirubin Total: 0.5 mg/dL (ref 0.0–1.2)
CO2: 22 mmol/L (ref 20–29)
Calcium: 10.2 mg/dL (ref 8.7–10.3)
Chloride: 96 mmol/L (ref 96–106)
Creatinine, Ser: 0.86 mg/dL (ref 0.57–1.00)
Globulin, Total: 2.6 g/dL (ref 1.5–4.5)
Glucose: 107 mg/dL — ABNORMAL HIGH (ref 70–99)
Potassium: 4.5 mmol/L (ref 3.5–5.2)
Sodium: 137 mmol/L (ref 134–144)
Total Protein: 7.3 g/dL (ref 6.0–8.5)
eGFR: 72 mL/min/1.73 (ref >59)

## 2024-07-18 LAB — TSH: TSH: 1.34 u[IU]/mL (ref 0.450–4.500)

## 2024-07-18 MED ORDER — DOXYCYCLINE HYCLATE 100 MG PO TABS: 100.0000 mg | ORAL_TABLET | Freq: Two times a day (BID) | ORAL | 0 refills | Status: DC

## 2024-07-18 MED ORDER — ALBUTEROL SULFATE HFA 108 (90 BASE) MCG/ACT IN AERS: 1.0000 | INHALATION_SPRAY | Freq: Four times a day (QID) | RESPIRATORY_TRACT | 1 refills | Status: AC | PRN

## 2024-07-19 ENCOUNTER — Ambulatory Visit: Payer: Self-pay | Admitting: Nurse Practitioner

## 2024-07-26 ENCOUNTER — Other Ambulatory Visit (HOSPITAL_COMMUNITY)

## 2024-07-26 ENCOUNTER — Ambulatory Visit (HOSPITAL_COMMUNITY)

## 2024-08-09 ENCOUNTER — Other Ambulatory Visit (HOSPITAL_COMMUNITY)

## 2024-08-09 ENCOUNTER — Ambulatory Visit (HOSPITAL_COMMUNITY)

## 2024-09-15 ENCOUNTER — Other Ambulatory Visit (HOSPITAL_COMMUNITY)

## 2024-09-15 ENCOUNTER — Ambulatory Visit (HOSPITAL_COMMUNITY)

## 2024-09-15 ENCOUNTER — Encounter (HOSPITAL_COMMUNITY): Payer: Self-pay

## 2024-09-22 ENCOUNTER — Ambulatory Visit

## 2024-10-24 ENCOUNTER — Ambulatory Visit: Admitting: Nurse Practitioner

## 2024-10-24 VITALS — BP 142/92 | HR 104 | Temp 97.8°F | Ht 60.0 in | Wt 144.5 lb

## 2024-10-24 DIAGNOSIS — J45901 Unspecified asthma with (acute) exacerbation: Secondary | ICD-10-CM

## 2024-10-24 DIAGNOSIS — B9689 Other specified bacterial agents as the cause of diseases classified elsewhere: Secondary | ICD-10-CM

## 2024-10-24 DIAGNOSIS — J209 Acute bronchitis, unspecified: Secondary | ICD-10-CM

## 2024-10-24 DIAGNOSIS — J069 Acute upper respiratory infection, unspecified: Secondary | ICD-10-CM

## 2024-10-24 DIAGNOSIS — F17200 Nicotine dependence, unspecified, uncomplicated: Secondary | ICD-10-CM | POA: Diagnosis not present

## 2024-10-24 MED ORDER — DOXYCYCLINE HYCLATE 100 MG PO TABS: 100.0000 mg | ORAL_TABLET | Freq: Two times a day (BID) | ORAL | 0 refills | Status: AC

## 2024-10-24 MED ORDER — PREDNISONE 20 MG PO TABS: ORAL_TABLET | ORAL | 0 refills | Status: AC

## 2024-10-24 MED ORDER — PROMETHAZINE-DM 6.25-15 MG/5ML PO SYRP: 5.0000 mL | ORAL_SOLUTION | Freq: Four times a day (QID) | ORAL | 0 refills | Status: AC | PRN

## 2024-10-24 NOTE — Progress Notes (Unsigned)
" ° °  Subjective:    Patient ID: Katrina Dean, female    DOB: October 31, 1950, 74 y.o.   MRN: [id number]  HPI Patient is here for having a cough, chest congested, ear pain, and scratchy throat only on one side of the head.  It has been going on since Friday  Mucus has been clear as patient stated  Discussed the use of AI scribe software for clinical note transcription with the patient, who gave verbal consent to proceed.  History of Present Illness Katrina Dean is a 74 year old female who presents with earache and upper respiratory symptoms.  She has been experiencing right-sided earache for several days, with pain radiating down her throat. The throat pain is described as feeling like 'swallowing glass' when drinking water or other beverages, except for coffee, which she can tolerate. The ear pain is described as pressure and is exacerbated by swallowing.  She reports chest congestion and a dry cough that occasionally produces clear sputum, beginning five days ago. No chest pain or shortness of breath is noted, but she experiences wheezing, particularly at night, which is relieved by using her albuterol  inhaler. She uses Advair, one puff in the morning and one at night, and has not needed to use her albuterol  inhaler frequently. Over-the-counter medications like Tylenol and Robitussin have helped calm her cough.  She feels nauseous but denies vomiting or diarrhea. No sinus headache or pressure is reported.  She smokes half a pack per day.       Objective:   Physical Exam NAD.  Alert, oriented.  TMs clear effusion, no erythema.  Pharynx minimally injected with greenish PND noted.  Neck supple with mild soft anterior cervical adenopathy.  Lungs scattered faint expiratory crackles with occasional expiratory wheeze.  Mildly diminished breath sounds.  No tachypnea.  Occasional congested cough.  Normal color.  Heart regular rate rhythm. Today's Vitals   10/24/24 1058  BP: (!) 142/92  Pulse:  (!) 104  Temp: 97.8 F (36.6 C)  SpO2: 98%  Weight: 144 lb 8 oz (65.5 kg)  Height: 5' (1.524 m)   Body mass index is 28.22 kg/m.        Assessment & Plan:  1. Bacterial URI (Primary) Greenish postnasal drainage and chest congestion consistent with bronchitis. No pneumonia. Smoking increases infection risk. - Prescribed doxycycline  for 7 days. - Prescribed prednisone  40 mg daily to reduce wheezing and congestion. - Promethazine  DM for cough. - Advised immediate medical attention for high fever, chest pain, worsening wheezing, or difficulty breathing. - doxycycline  (VIBRA -TABS) 100 MG tablet; Take 1 tablet (100 mg total) by mouth 2 (two) times daily.  Dispense: 14 tablet; Refill: 0  2. Acute bronchitis with asthma with acute exacerbation Wheezing primarily at night, managed with Advair and albuterol . - Continue Advair, one puff morning and night. - Use albuterol  as needed for nocturnal wheezing. - predniSONE  (DELTASONE ) 20 MG tablet; Take one tab po BID x 5 days  Dispense: 10 tablet; Refill: 0 - promethazine -dextromethorphan (PROMETHAZINE -DM) 6.25-15 MG/5ML syrup; Take 5 mLs by mouth 4 (four) times daily as needed. For cough. Drowsiness precautions.  Dispense: 118 mL; Refill: 0  3. Tobacco use disorder Continue to limit tobacco use. Contact office if she wishes help with smoking cessation.  Return if symptoms worsen or fail to improve.      "

## 2024-10-26 ENCOUNTER — Encounter: Payer: Self-pay | Admitting: Nurse Practitioner

## 2024-11-02 ENCOUNTER — Ambulatory Visit
